# Patient Record
Sex: Female | Born: 1950 | Race: White | Hispanic: No | Marital: Married | Smoking: Former smoker
Health system: Southern US, Community
[De-identification: ages and names within clinical notes are randomized; demographics above are authoritative.]

## PROBLEM LIST (undated history)

## (undated) DIAGNOSIS — R911 Solitary pulmonary nodule: Secondary | ICD-10-CM

## (undated) DIAGNOSIS — I10 Essential (primary) hypertension: Secondary | ICD-10-CM

## (undated) DIAGNOSIS — I493 Ventricular premature depolarization: Secondary | ICD-10-CM

## (undated) DIAGNOSIS — E785 Hyperlipidemia, unspecified: Secondary | ICD-10-CM

## (undated) HISTORY — DX: Essential (primary) hypertension: I10

## (undated) HISTORY — DX: Hyperlipidemia, unspecified: E78.5

## (undated) HISTORY — DX: Ventricular premature depolarization: I49.3

## (undated) HISTORY — DX: Solitary pulmonary nodule: R91.1

---

## 1996-05-08 HISTORY — PX: HERNIA REPAIR: SHX51

## 1997-07-07 ENCOUNTER — Ambulatory Visit (HOSPITAL_COMMUNITY): Admission: RE | Admit: 1997-07-07 | Discharge: 1997-07-07 | Payer: Self-pay | Admitting: *Deleted

## 1998-01-25 ENCOUNTER — Ambulatory Visit (HOSPITAL_COMMUNITY): Admission: RE | Admit: 1998-01-25 | Discharge: 1998-01-25 | Payer: Self-pay

## 1999-05-09 HISTORY — PX: TOTAL VAGINAL HYSTERECTOMY: SHX2548

## 1999-07-11 ENCOUNTER — Ambulatory Visit (HOSPITAL_COMMUNITY): Admission: RE | Admit: 1999-07-11 | Discharge: 1999-07-11 | Payer: Self-pay

## 1999-12-26 ENCOUNTER — Encounter (INDEPENDENT_AMBULATORY_CARE_PROVIDER_SITE_OTHER): Payer: Self-pay

## 1999-12-26 ENCOUNTER — Inpatient Hospital Stay (HOSPITAL_COMMUNITY): Admission: RE | Admit: 1999-12-26 | Discharge: 1999-12-28 | Payer: Self-pay | Admitting: Obstetrics and Gynecology

## 2000-11-01 ENCOUNTER — Ambulatory Visit (HOSPITAL_COMMUNITY): Admission: RE | Admit: 2000-11-01 | Discharge: 2000-11-01 | Payer: Self-pay | Admitting: Obstetrics and Gynecology

## 2000-11-01 ENCOUNTER — Encounter (INDEPENDENT_AMBULATORY_CARE_PROVIDER_SITE_OTHER): Payer: Self-pay

## 2001-03-07 ENCOUNTER — Encounter: Payer: Self-pay | Admitting: Obstetrics and Gynecology

## 2001-03-07 ENCOUNTER — Ambulatory Visit (HOSPITAL_COMMUNITY): Admission: RE | Admit: 2001-03-07 | Discharge: 2001-03-07 | Payer: Self-pay | Admitting: Obstetrics and Gynecology

## 2001-03-12 ENCOUNTER — Encounter: Admission: RE | Admit: 2001-03-12 | Discharge: 2001-03-12 | Payer: Self-pay | Admitting: Obstetrics and Gynecology

## 2001-03-12 ENCOUNTER — Encounter: Payer: Self-pay | Admitting: Obstetrics and Gynecology

## 2001-03-27 ENCOUNTER — Other Ambulatory Visit: Admission: RE | Admit: 2001-03-27 | Discharge: 2001-03-27 | Payer: Self-pay | Admitting: Obstetrics and Gynecology

## 2002-04-16 ENCOUNTER — Other Ambulatory Visit: Admission: RE | Admit: 2002-04-16 | Discharge: 2002-04-16 | Payer: Self-pay | Admitting: Obstetrics and Gynecology

## 2002-05-23 ENCOUNTER — Encounter: Admission: RE | Admit: 2002-05-23 | Discharge: 2002-05-23 | Payer: Self-pay | Admitting: Obstetrics and Gynecology

## 2002-05-23 ENCOUNTER — Encounter: Payer: Self-pay | Admitting: Obstetrics and Gynecology

## 2002-12-31 ENCOUNTER — Ambulatory Visit (HOSPITAL_COMMUNITY): Admission: RE | Admit: 2002-12-31 | Discharge: 2002-12-31 | Payer: Self-pay | Admitting: Obstetrics and Gynecology

## 2002-12-31 ENCOUNTER — Encounter: Payer: Self-pay | Admitting: Obstetrics and Gynecology

## 2003-07-07 ENCOUNTER — Other Ambulatory Visit: Admission: RE | Admit: 2003-07-07 | Discharge: 2003-07-07 | Payer: Self-pay | Admitting: Obstetrics and Gynecology

## 2004-05-20 ENCOUNTER — Ambulatory Visit (HOSPITAL_COMMUNITY): Admission: RE | Admit: 2004-05-20 | Discharge: 2004-05-20 | Payer: Self-pay | Admitting: Obstetrics and Gynecology

## 2005-03-17 ENCOUNTER — Ambulatory Visit (HOSPITAL_COMMUNITY): Admission: RE | Admit: 2005-03-17 | Discharge: 2005-03-17 | Payer: Self-pay | Admitting: Family Medicine

## 2005-03-17 IMAGING — CR DG CHEST 2V
2 series · 2 of 2 positions shown · non-contrast
Comparison: None.

CLINICAL DATA: Cough and shortness of breath.
CHEST - 2 VIEW ? [DATE]:

[w chest pa]
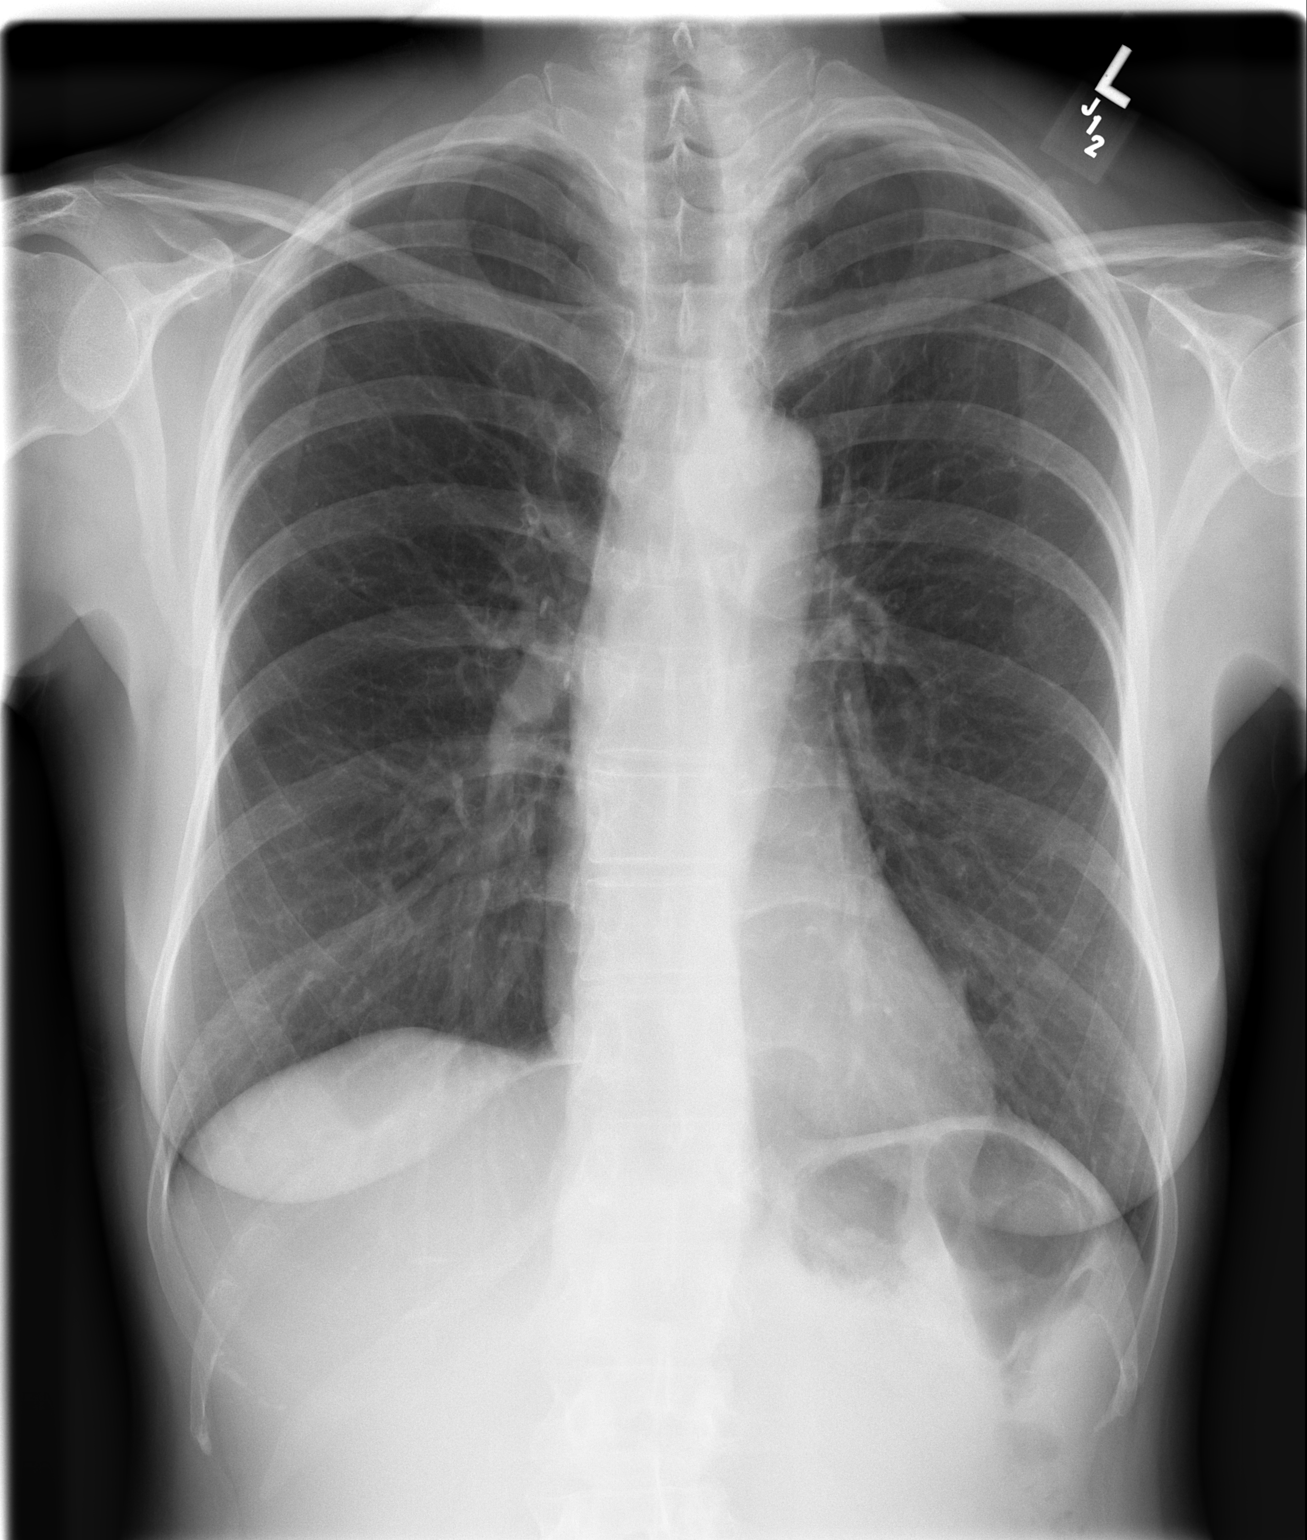

[w chest lat]
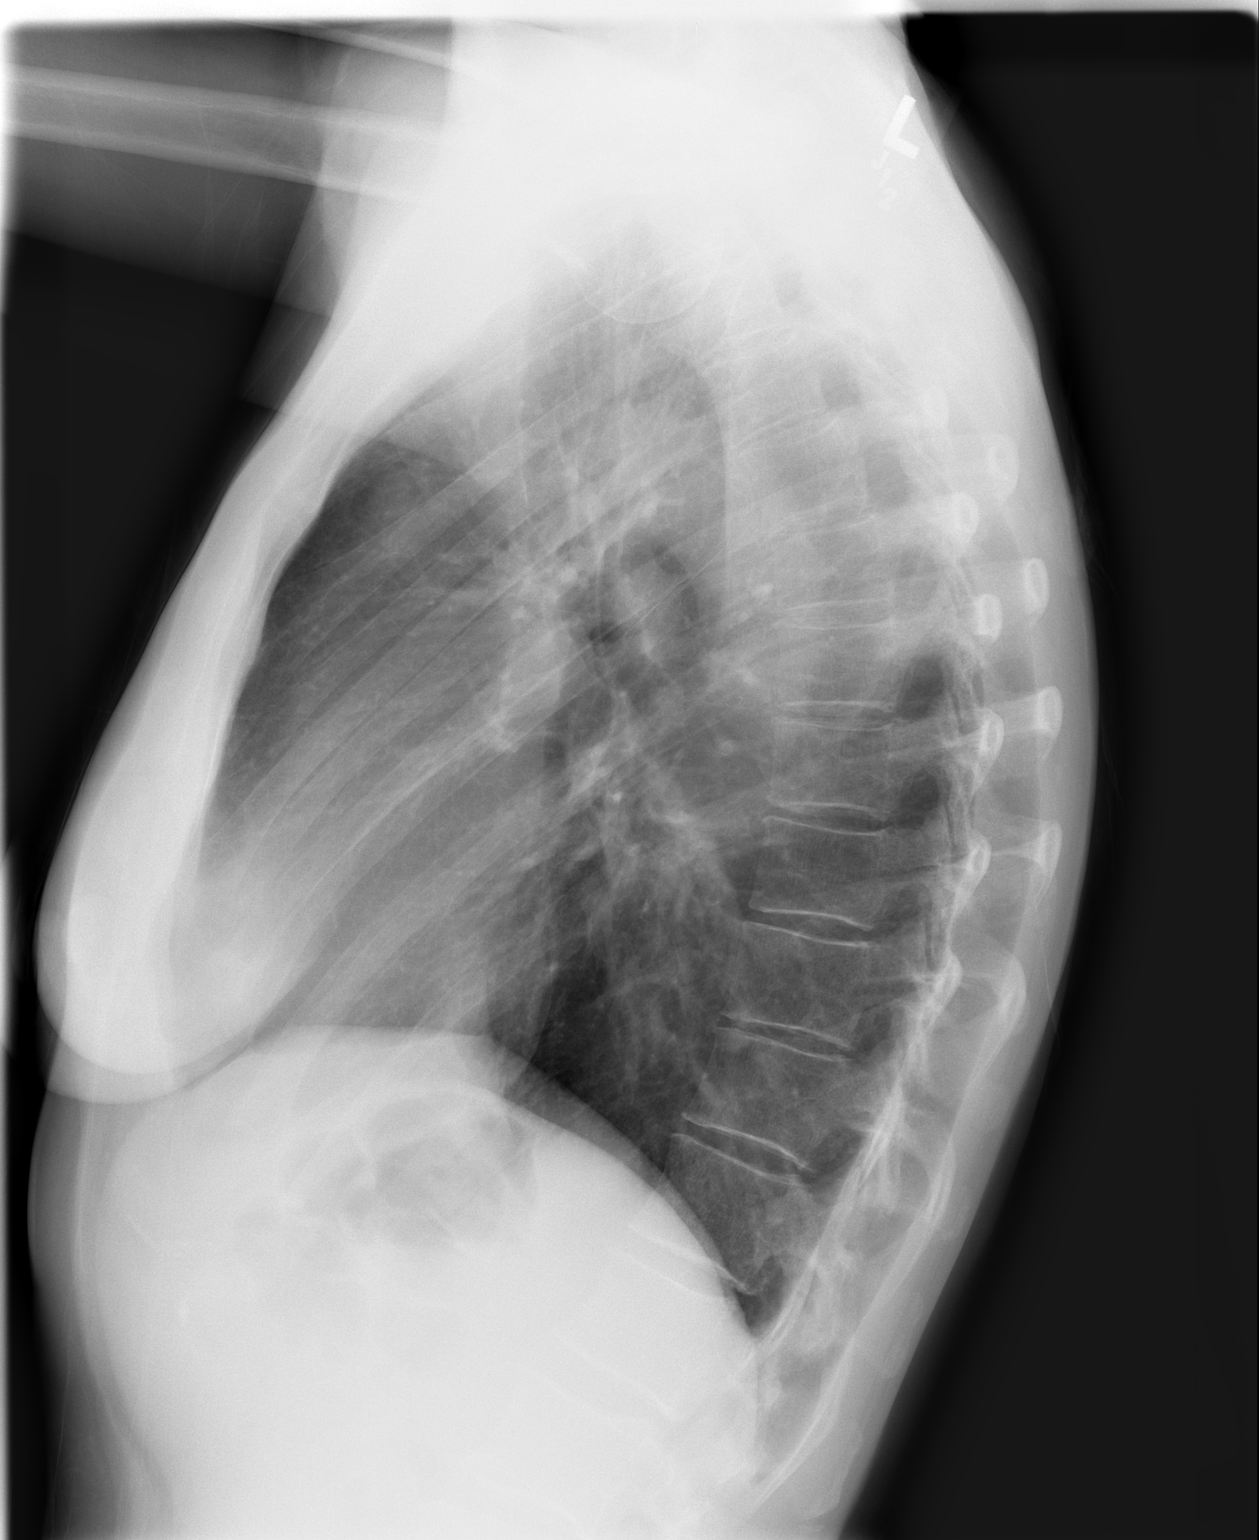

[2 of 2 positions shown; findings below may reference images not displayed]

FINDINGS: Two views of the chest demonstrate normal heart size.  There are no effusions or edema.  No acute air space opacities.  There is a focal opacity in the left lung base.  The finding is nonspecific and may represent a small nodule.  Recommend comparison with outside films if available.  Alternatively, three month follow-up exam is recommended.
IMPRESSION: Suspicous nodular opacity at the left lung base.

## 2005-03-23 ENCOUNTER — Encounter: Admission: RE | Admit: 2005-03-23 | Discharge: 2005-03-23 | Payer: Self-pay | Admitting: Family Medicine

## 2005-11-01 ENCOUNTER — Ambulatory Visit (HOSPITAL_COMMUNITY): Admission: RE | Admit: 2005-11-01 | Discharge: 2005-11-01 | Payer: Self-pay | Admitting: Family Medicine

## 2007-10-04 ENCOUNTER — Ambulatory Visit (HOSPITAL_COMMUNITY): Admission: RE | Admit: 2007-10-04 | Discharge: 2007-10-04 | Payer: Self-pay | Admitting: Obstetrics and Gynecology

## 2007-11-11 ENCOUNTER — Emergency Department: Payer: Self-pay | Admitting: Emergency Medicine

## 2010-02-18 ENCOUNTER — Ambulatory Visit (HOSPITAL_COMMUNITY): Admission: RE | Admit: 2010-02-18 | Discharge: 2010-02-18 | Payer: Self-pay | Admitting: Obstetrics and Gynecology

## 2010-09-23 NOTE — Discharge Summary (Signed)
[facility]  Patient:    Michelle Moreno, Michelle Moreno                     MRN: [id number] Adm. Date:  [id number] Disc. Date: [id number] Attending:  Lenoard Aden                           Discharge Summary  DISCHARGE DIAGNOSIS:  Symptomatic fibroids with menometrorrhagia and enterocele.  HOSPITAL COURSE:  The patient underwent LAVHBSO, uterine morcellization, McCullough culdoplasty and multiple myomectomy on December 26, 1999. Postoperative course uncomplicated.  Postoperative anemia was stable.  She was discharged to home on day #2.  DISCHARGE MEDICATIONS:  Iron supplementation and Tylox given for pain.  FOLLOW-UP:  Follow up in the office in four to six weeks.  Discharge teaching done. DD:  01/12/00 TD:  01/14/00 Job: [id number]

## 2010-09-23 NOTE — Op Note (Signed)
[facility]  Patient:    Michelle Moreno, Michelle Moreno                     MRN: [id number] Proc. Date: 12/26/99 Attending:  Lenoard Aden, M.D.                           Operative Report  PREOPERATIVE DIAGNOSIS:  Symptomatic uterine fibroids, menometrorrhagia secondary anemia.  POSTOPERATIVE DIAGNOSIS:  Symptomatic uterine fibroids, menometrorrhagia secondary anemia.  OPERATION PERFORMED:  Laparoscopically assisted vaginal hysterectomy, bilateral salpingo-oophorectomy and extensive uterine morcellization with McCalls culdoplasty.  SURGEON:  Lenoard Aden, M.D.  ASSISTANT:  Pershing Cox, M.D.  ANESTHESIA:  General.  ESTIMATED BLOOD LOSS:  1250 cc.  COMPLICATIONS:  None.  FLUIDS:  Crystalloid 3700 cc.  URINE OUTPUT:  200 cc.  DRAINS:  Vaginal pack and Foley.  CONDITION:  The patient to recovery in good condition.  DESCRIPTION OF PROCEDURE:  After being appraised of the risks of anesthesia, infection, bleeding, injury to abdominal organs, need for repair, the patient was brought to the operating room where she was prepped and draped in the usual sterile fashion and administered a general anesthetic.  A Foley catheter was placed and the Hulka tenaculum placed per vagina.  At this time, exam under anesthesia reveals a bulky retroflexed uterus with good mobility and no adnexal masses.  At this time, attention the patients feet were placed in the stirrups and attention is turned to the abdominal portion of the procedure where by an infraumbilical incision was made with a scalpel.  A Veress needle placed and opening pressure of -2 noted, and 5-liters of CO2 insufflated without difficulty.  A 10 mm trocar placed atraumatically.  Pictures were taken.  Two 5 mm trocar sites were made in the bilateral lower quadrants atraumatically after transilluminating the anterior abdominal wall to avoid any blood vessels.  The liver and gallbladder bed  appeared normal.  The appendiceal bed appeared normal.  The uterus is markedly bulky with a completely clear anterior and posterior cul-de-sac, however, manipulation is difficult due to the size of the uterus.  At this time, tripolar cautery is entered after the right ureter is identified.  The right infundibulopelvic ligament is cauterized and ligated. Progressive bites using the tripolar are taken down to the round ligament and then down to the level of the uterine vessels on the right.  The same procedure is done on the left side whereby the tripolar was used after the left ureter was identified down to the round ligament and then down to the level of the uterine vessels ligating all pedicles in the process.  The bladder flap was developed sharply.  At this time, attention is turned to the vaginal portion of the procedure whereby Deri Fuelling are placed anteriorly and posteriorly on the cervical lip and the cervicovaginal junction is scored using a scalpel after infiltrating with the Xylocaine and epinephrine solution.  Posterior cul-de-sac entry made atraumatically.  There is a small crater along the vaginal cuff which is tied using a 3-0 Vicryl tie.  The uterosacrals are bilaterally clamped and suture ligated, and transfixed to the vaginal cuff. Progressive bites of the cardinal ligament complexes are taken using Heaney clamps with continuing advancement of the bladder flap using sharp dissection. At this time, the anterior cul-de-sac entry is made atraumatically and a Deaver retractor is placed.  At this time, the uterine vessels are bilaterally clamped and suture  ligated.  At this time, it is noted that due to the extensive size of the specimen, uterine morcellization will be necessary. After identifying what appeared to be uterine vessels bilaterally, the cervix is amputated and wedge shaped portions are taken out of the anterior and posterior portions of the uterus staying  inside the blood vessels laterally.  At this time, removal was more easily accomplished along the left side whereby progressive bites using Heaney clamps and Masterson clamps are taken up to the level of the laparoscopic suture line.  The specimen is divided on the left side, but still unable to be removed due to marked enlargement of the specimen extensive morcellization coring out uterine fibroids and performing a multiple myomectomy by extravasating these fibroids and grasping them with a Christella Hartigan tenaculae and pulling them out in order to collapse the specimen.  After complete collapsing of the specimen, the uterus is then removed.  It is still attached to the right side whereby the one last pedicle was clamped and suture ligated.  The enterocele identified at the time of the procedure.  It is closed using an internal and external McCalls culdoplasty stitch using 0 Vicryl suture.  The vagina closed side-to-side using 0 Vicryl popoff and packing is placed.  At this time, attention is turned to the abdominal portion of the procedure whereby all pedicles are inspected.  There is a left adnexal pedicle which appears to be oozing.  After letting out the pressure, the abdomen is then reinsufflated and this pedicle is cauterized using the tripolar cautery after identifying the ureters bilaterally.  At this time once again, good hemostasis was achieved.  All instruments were removed under direct visualization after irrigation with the irrigator.  The incisions are closed using 0 and 4-0 Vicryl with dilute Marcaine solution placed.  The patient tolerated the procedure well and was transferred to the recovery room in good condition.  Due to the extensive size of the uterus and the extensive morcellization and myomectomy as necessary, total time of this case was approximately three hours.  DD:  12/26/99 TD:  12/27/99 Job: [id number]

## 2010-09-23 NOTE — H&P (Signed)
[facility]  Patient:    Michelle Moreno, Michelle Moreno                       MRN: [id number] Adm. Date:  12/26/99 Attending:  Lenoard Aden, M.D.                         History and Physical  CHIEF COMPLAINT:  Symptomatic uterine fibroids and menorrhagia with secondary anemia.  HISTORY OF PRESENT ILLNESS:  The patient is a 60 year old white female, G3, P1-0-2-1, who presented initially on November 03, 1999 with secondary anemia, menometrorrhagia and pelvic pain.  Her hemoglobin was 6.7.  She was previously given a Lo/Ovral taper, which markedly decreased her bleeding.  PAST SURGICAL HISTORY:  Hernia repair March 2001.  ALLERGIES:  None.  MEDICATIONS:  Lo/Ovral, iron, ibuprofen and Benadryl.  FAMILY HISTORY:  Myocardial infarction and breast cancer.  PAST GYNECOLOGIC HISTORY:  History of tubal ligation.  She has had one uncomplicated vaginal delivery and two uncomplicated abortions.  PHYSICAL EXAMINATION:  GENERAL:  Well-developed well-nourished white female.  VITAL SIGNS:  Weight 146 pounds.  HEENT:  Normal.  LUNGS:  Clear.  HEART:  Regular rate and rhythm.  ABDOMEN:  Soft, scaphoid and nontender.  PELVIC:  The uterus is large and retroflexed.  No adnexal masses are appreciated.  IMPRESSION: 1. Symptomatic uterine fibroids with menometrorrhagia. 2. Secondary anemia. 3. Dysmenorrhea. 4. Desire for definitive therapy.  PLAN:  Proceed with laparoscopically assisted vaginal hysterectomy, bilateral salpingo-oophorectomy, possible total abdominal hysterectomy and bilateral salpingo-oophorectomy.  The risks of anesthesia, infection, bleeding, injury to abdominal organs with the need for repair were discussed.  Laparoscopic complications to include delayed versus immediate complications including bowel injury or bladder injury with the possible need for repair were noted. The patient acknowledges and desires to proceed. DD:  12/25/99 TD:   12/26/99 Job: [id number]

## 2010-09-23 NOTE — Op Note (Signed)
[facility]  Patient:    Michelle Moreno, Michelle Moreno                       MRN: [id number] Proc. Date: 11/01/00 Attending:  Lenoard Aden, M.D. CC:         [facility]   Operative Report  PREOPERATIVE DIAGNOSES:       1. Postcoital bleeding.                               2. Dyspareunia.                               3. Vaginal apex lesion.  POSTOPERATIVE DIAGNOSIS:      1. Postcoital bleeding.                               2. Dyspareunia.                               3. Vaginal apex lesion.  PROCEDURE:                    Excision of vaginal apex.  SURGEON:                      Lenoard Aden, M.D.  ANESTHESIA:                   General.  ESTIMATED BLOOD LOSS:         50 cc.  COMPLICATIONS:                None.  DRAINS:                       None.  SPECIMEN:                     Vaginal apex.  CONDITION:                    The patient went to recovery in good condition.  DESCRIPTION OF PROCEDURE:     After being apprised of the risks of anesthesia, infection, bleeding, injury to abdominal organs and need for repair, the patient was brought to the operating room where she was administered a general anesthesia without complications.  Prepped and draped in the usual sterile fashion.  Bladder drained until empty using a red rubber catheter.  At this time, a weighted speculum was placed.  A ______ solution used to infiltrate the mid to left vaginal apex, which shows an area of apparent extruding glandular tissue consistent with either chronic inflammation or chronic granulation tissue.  After infiltration of the lesion, the edges are undercircumscribed using a scalpel and undermined using Metzenbaum scissors. The lesion is removed piecemeal.  The base of the lesion is then cauterized using electrocautery.  The edges are cleaned and undermined using Metzenbaum scissors and the apical lesion defect is closed using 3-0 Vicryl in an interrupted  fashion using figure-of-eight sutures and a cephalocaudad direction.  At this time, good hemostasis achieved.  All instruments are removed.  The patient tolerated the procedure and transferred to the recovery room in good condition. DD:  11/01/00 TD:  11/01/00 Job: [id number]  [id number]

## 2011-09-19 ENCOUNTER — Encounter: Payer: Self-pay | Admitting: *Deleted

## 2011-10-17 ENCOUNTER — Encounter: Payer: Self-pay | Admitting: Internal Medicine

## 2011-12-08 ENCOUNTER — Ambulatory Visit (AMBULATORY_SURGERY_CENTER): Payer: Medicare HMO | Admitting: *Deleted

## 2011-12-08 VITALS — Ht 67.5 in | Wt 146.2 lb

## 2011-12-08 DIAGNOSIS — Z1211 Encounter for screening for malignant neoplasm of colon: Secondary | ICD-10-CM

## 2011-12-08 MED ORDER — MOVIPREP 100 G PO SOLR: ORAL | Status: DC

## 2011-12-22 ENCOUNTER — Encounter: Payer: Self-pay | Admitting: Internal Medicine

## 2012-01-17 ENCOUNTER — Telehealth: Payer: Self-pay | Admitting: Gastroenterology

## 2012-01-17 NOTE — Telephone Encounter (Signed)
Pt is calling to let us know she is not taking Advair anymore.

## 2012-01-19 ENCOUNTER — Encounter: Payer: Self-pay | Admitting: Gastroenterology

## 2012-01-19 ENCOUNTER — Ambulatory Visit (AMBULATORY_SURGERY_CENTER): Payer: Medicare HMO | Admitting: Gastroenterology

## 2012-01-19 VITALS — BP 144/96 | HR 66 | Temp 98.1°F | Resp 17 | Ht 67.5 in | Wt 146.0 lb

## 2012-01-19 DIAGNOSIS — D126 Benign neoplasm of colon, unspecified: Secondary | ICD-10-CM

## 2012-01-19 DIAGNOSIS — Z1211 Encounter for screening for malignant neoplasm of colon: Secondary | ICD-10-CM

## 2012-01-19 MED ORDER — SODIUM CHLORIDE 0.9 % IV SOLN: 500.0000 mL | INTRAVENOUS | Status: DC

## 2012-01-19 NOTE — Progress Notes (Signed)
Pressure applied to the abdomen to reach the cecum 

## 2012-01-19 NOTE — Progress Notes (Signed)
Patient did not experience any of the following events: a burn prior to discharge; a fall within the facility; wrong site/side/patient/procedure/implant event; or a hospital transfer or hospital admission upon discharge from the facility. (G8907) Patient did not have preoperative order for IV antibiotic SSI prophylaxis. (G8918)  

## 2012-01-19 NOTE — Patient Instructions (Addendum)

## 2012-01-19 NOTE — Op Note (Signed)
[facility] [address] [place], [address]   COLONOSCOPY PROCEDURE REPORT  PATIENT: Syd, Manges  MR#: [id number] BIRTHDATE: 1950/05/27 , 61  yrs. old GENDER: Female ENDOSCOPIST: Meryl Dare, MD, [facility] REFERRED OZ:HYQMV Hamrick, M.D.  Olivia Mackie, M.D. PROCEDURE DATE:  01/19/2012 PROCEDURE:   Colonoscopy with snare polypectomy ASA CLASS:   Class II INDICATIONS:average risk screening. MEDICATIONS: MAC sedation, administered by CRNA and propofol (Diprivan) 250mg  IV DESCRIPTION OF PROCEDURE:   After the risks benefits and alternatives of the procedure were thoroughly explained, informed consent was obtained.  A digital rectal exam revealed no abnormalities of the rectum.   The LB [id number]-[id number]  endoscope was introduced through the anus and advanced to the cecum, which was identified by both the appendix and ileocecal valve. No adverse events experienced.   The quality of the prep was excellent, using MoviPrep  The instrument was then slowly withdrawn as the colon was fully examined.   COLON FINDINGS: A single polyp measuring 6 mm in size was found in the sigmoid colon.  A polypectomy was performed with a cold snare. The resection was complete and the polyp tissue was completely retrieved.   Moderate diverticulosis was noted in the sigmoid colon. The colon was otherwise normal. There was no diverticulosis, inflammation, polyps or cancers unless previously stated. Retroflexed views revealed moderate internal hemorrhoids. The time to cecum=7 minutes 0 seconds.  Withdrawal time=10 minutes 03 seconds.  The scope was withdrawn and the procedure completed.  COMPLICATIONS: There were no complications.  ENDOSCOPIC IMPRESSION: 1.   Single polyp in the sigmoid colon; polypectomy was performed with a cold snare 2.   Moderate diverticulosis was noted in the sigmoid colon 3.   Internal hemorrhoids  RECOMMENDATIONS: 1.  await pathology results 2.   High fiber diet with liberal fluid intake. 3.  Repeat colonoscopy in 5 years if polyp adenomatous; otherwise 10 years eSigned:  Meryl Dare, MD, [facility] 01/19/2012 11:08 AM

## 2012-01-22 ENCOUNTER — Telehealth: Payer: Self-pay | Admitting: *Deleted

## 2012-01-22 NOTE — Telephone Encounter (Signed)
No answer at # given.  Message left on voicemail. 

## 2012-01-23 ENCOUNTER — Encounter: Payer: Self-pay | Admitting: Gastroenterology

## 2012-10-14 ENCOUNTER — Other Ambulatory Visit (HOSPITAL_COMMUNITY): Payer: Self-pay | Admitting: Obstetrics and Gynecology

## 2012-10-14 DIAGNOSIS — Z1231 Encounter for screening mammogram for malignant neoplasm of breast: Secondary | ICD-10-CM

## 2012-10-18 ENCOUNTER — Ambulatory Visit (HOSPITAL_COMMUNITY)
Admission: RE | Admit: 2012-10-18 | Discharge: 2012-10-18 | Disposition: A | Discharge status: Home / Self Care | Payer: BC Managed Care – PPO | Source: Ambulatory Visit | Attending: Obstetrics and Gynecology | Admitting: Obstetrics and Gynecology

## 2012-10-18 DIAGNOSIS — Z1231 Encounter for screening mammogram for malignant neoplasm of breast: Secondary | ICD-10-CM | POA: Insufficient documentation

## 2012-10-21 ENCOUNTER — Other Ambulatory Visit: Payer: Self-pay | Admitting: Obstetrics and Gynecology

## 2012-10-21 DIAGNOSIS — R928 Other abnormal and inconclusive findings on diagnostic imaging of breast: Secondary | ICD-10-CM

## 2012-11-01 ENCOUNTER — Ambulatory Visit
Admission: RE | Admit: 2012-11-01 | Discharge: 2012-11-01 | Disposition: A | Discharge status: Home / Self Care | Payer: BC Managed Care – PPO | Source: Ambulatory Visit | Attending: Obstetrics and Gynecology | Admitting: Obstetrics and Gynecology

## 2012-11-01 DIAGNOSIS — R928 Other abnormal and inconclusive findings on diagnostic imaging of breast: Secondary | ICD-10-CM

## 2013-01-01 ENCOUNTER — Ambulatory Visit (INDEPENDENT_AMBULATORY_CARE_PROVIDER_SITE_OTHER): Payer: Self-pay | Admitting: Surgery

## 2013-01-13 ENCOUNTER — Ambulatory Visit (INDEPENDENT_AMBULATORY_CARE_PROVIDER_SITE_OTHER): Payer: BC Managed Care – PPO | Admitting: Surgery

## 2014-02-12 ENCOUNTER — Institutional Professional Consult (permissible substitution): Payer: BC Managed Care – PPO | Admitting: Internal Medicine

## 2014-03-20 ENCOUNTER — Institutional Professional Consult (permissible substitution): Payer: BC Managed Care – PPO | Admitting: Internal Medicine

## 2014-05-12 ENCOUNTER — Telehealth: Payer: Self-pay | Admitting: Internal Medicine

## 2014-05-12 NOTE — Telephone Encounter (Signed)
Close encounter 

## 2014-05-13 ENCOUNTER — Institutional Professional Consult (permissible substitution): Payer: BC Managed Care – PPO | Admitting: Internal Medicine

## 2014-05-19 ENCOUNTER — Ambulatory Visit: Payer: Self-pay | Admitting: Family Medicine

## 2014-05-27 ENCOUNTER — Telehealth: Payer: Self-pay | Admitting: Internal Medicine

## 2014-05-27 NOTE — Telephone Encounter (Signed)
Received records from [facility] (Dr Daiva Eves) for appointment with Dr Debara Pickett on 06/11/14.  Records given to [facility] (medical records) for Dr Lysbeth Penner schedule on 06/11/14. lp

## 2014-06-11 ENCOUNTER — Encounter: Payer: Self-pay | Admitting: Internal Medicine

## 2014-06-11 ENCOUNTER — Ambulatory Visit (INDEPENDENT_AMBULATORY_CARE_PROVIDER_SITE_OTHER): Payer: 59 | Admitting: Internal Medicine

## 2014-06-11 VITALS — BP 156/90 | HR 88 | Ht 67.5 in | Wt 155.0 lb

## 2014-06-11 DIAGNOSIS — E785 Hyperlipidemia, unspecified: Secondary | ICD-10-CM

## 2014-06-11 DIAGNOSIS — F17200 Nicotine dependence, unspecified, uncomplicated: Secondary | ICD-10-CM

## 2014-06-11 DIAGNOSIS — Z8249 Family history of ischemic heart disease and other diseases of the circulatory system: Secondary | ICD-10-CM

## 2014-06-11 DIAGNOSIS — Z720 Tobacco use: Secondary | ICD-10-CM

## 2014-06-11 DIAGNOSIS — R9431 Abnormal electrocardiogram [ECG] [EKG]: Secondary | ICD-10-CM | POA: Insufficient documentation

## 2014-06-11 DIAGNOSIS — I493 Ventricular premature depolarization: Secondary | ICD-10-CM

## 2014-06-11 DIAGNOSIS — I10 Essential (primary) hypertension: Secondary | ICD-10-CM

## 2014-06-11 NOTE — Progress Notes (Signed)
OFFICE NOTE  Chief Complaint:  Risk factor assessment  Primary Care Physician: Leonides Sake, MD  HPI:  Michelle Moreno is a pleasant 64 year old female coming referred to me by Dr. Edwyna Ready. Her primary care provider is Dr. Lisbeth Ply. Her family history is very significant for heart disease. She tells me that her mother had a abdominal aortic aneurysm and is deceased. Her father heart disease. She also has a brother has heart disease at a young age and a sister who had a cerebral aneurysm and died. Personally she has no known coronary history. She does have a history of smoking in the past and risk factors including hypertension, age, dyslipidemia and is had PVCs. She denies any chest pain or worsening shortness of breath with exertion however is not particularly active. She is going to start doing yoga soon and wants to know if it's okay for her to start exercise. She also has concerns about her mother who had abdominal aortic aneurysm. Given her history of smoking and hypertension as well as family history she may be at risk for this. In addition, she is wondering whether she should have screening for cerebral aneurysms. Current guidelines recommend screening for cerebral aneurysms in patients who have 2 first-degree relatives with cerebral aneurysms. The incidence of cerebral aneurysm in patients with one first-degree relative is very low around 1%, therefore screening is not likely to be beneficial.  PMHx:  Past Medical History  Diagnosis Date  . Lung nodule   . Asthma   . PVC's (premature ventricular contractions)   . HTN (hypertension)   . Hyperlipidemia     Past Surgical History  Procedure Laterality Date  . Total vaginal hysterectomy  2001  . Hernia repair  1998    abdominal    FAMHx:  Family History  Problem Relation Age of Onset  . Coronary artery disease Brother   . Cerebral aneurysm Sister   . Alzheimer's disease    . Hypertension    . Colon cancer Neg Hx   .  Stomach cancer Neg Hx     SOCHx:   reports that she quit smoking about 13 years ago. She has never used smokeless tobacco. She reports that she drinks about 4.2 oz of alcohol per week. She reports that she does not use illicit drugs.  ALLERGIES:  No Known Allergies  ROS: A comprehensive review of systems was negative.  HOME MEDS: Current Outpatient Prescriptions  Medication Sig Dispense Refill  . busPIRone (BUSPAR) 15 MG tablet Take 7.5 mg by mouth 2 (two) times daily.    Marland Kitchen estradiol (CLIMARA - DOSED IN MG/24 HR) 0.1 mg/24hr Place 1 patch onto the skin once a week.     . Fluticasone-Salmeterol (ADVAIR) 250-50 MCG/DOSE AEPB Inhale 1 puff into the lungs every 12 (twelve) hours.    . meloxicam (MOBIC) 15 MG tablet Take 15 mg by mouth daily. with food  2   No current facility-administered medications for this visit.    LABS/IMAGING: No results found for this or any previous visit (from the past 48 hour(s)). No results found.  VITALS: BP 156/90 mmHg  Pulse 88  Ht 5' 7.5" (1.715 m)  Wt 155 lb (70.308 kg)  BMI 23.90 kg/m2  EXAM: General appearance: alert and no distress Neck: no carotid bruit and no JVD Lungs: clear to auscultation bilaterally Heart: regular rate and rhythm, S1, S2 normal, no murmur, click, rub or gallop and Occasional skipped beats Abdomen: soft, non-tender; bowel sounds normal; no masses,  no organomegaly Extremities: extremities normal, atraumatic, no cyanosis or edema Pulses: 2+ and symmetric Skin: Skin color, texture, turgor normal. No rashes or lesions Neurologic: Grossly normal Psych: Pleasant  EKG: Normal sinus rhythm at 88, nonspecific T wave changes, mildly prolonged QT interval at 493 ms  ASSESSMENT: 1. Abnormal EKG 2. History of tobacco abuse 3. Hypertension 4. Family history of abdominal aortic aneurysm and cerebral aneurysm 5. Dyslipidemia  PLAN: 1.   Mrs. Farone has a number of risk factors for heart disease and a family history  which is concerning. She was a smoker and has a history of hypertension. In her 70s with a first-degree relative of abdominal aortic aneurysm. I would recommend a screening abdominal ultrasound to rule out aneurysm. I did not detect any significant aneurysm on exam however this is a low sensitivity test. In addition, she is content to play starting exercise program and does have some abnormalities on her EKG. I recommended exercise treadmill stress test to evaluate for any ischemia. To answer the question of whether she has coronary disease or not a coronary calcium score would be helpful in further risk stratify her. Finally, based on her first-degree relative with cerebral aneurysms comments reasonable to consider a head MRI, however the yield is fairly low at only 1%. This is somewhat reassuring. I think we can probably feel pretty comfortable that she is at low risk for cerebral aneurysm based on only one family member with this.  Plan to see her back to discuss results of her studies in a few weeks. We'll go ahead and obtain laboratory work including cholesterol profile from her gynecologist.  Thanks as always for the kind referral.  Pixie Casino, MD, [facility] Attending Cardiologist [facility]  HILTY,Kenneth C 06/11/2014, 1:15 PM

## 2014-06-11 NOTE — Patient Instructions (Addendum)
Your physician has requested that you have an exercise tolerance test. For further information please visit [email or web address]. Please also follow instruction sheet, as given.  Your physician has requested that you have an abdominal aorta duplex. During this test, an ultrasound is used to evaluate the aorta. Allow 30 minutes for this exam. Do not eat after midnight the day before and avoid carbonated beverages  Dr. Debara Pickett has ordered a coronary calcium score - this is done at our [facility] office - [address]   Your physician recommends that you schedule a follow-up appointment after your tests.

## 2014-06-18 ENCOUNTER — Ambulatory Visit (INDEPENDENT_AMBULATORY_CARE_PROVIDER_SITE_OTHER)
Admission: RE | Admit: 2014-06-18 | Discharge: 2014-06-18 | Disposition: A | Discharge status: Home / Self Care | Payer: 59 | Source: Ambulatory Visit | Attending: Internal Medicine | Admitting: Internal Medicine

## 2014-06-18 DIAGNOSIS — R9431 Abnormal electrocardiogram [ECG] [EKG]: Secondary | ICD-10-CM

## 2014-06-18 DIAGNOSIS — Z8249 Family history of ischemic heart disease and other diseases of the circulatory system: Secondary | ICD-10-CM

## 2014-06-18 DIAGNOSIS — I10 Essential (primary) hypertension: Secondary | ICD-10-CM

## 2014-06-30 ENCOUNTER — Telehealth (HOSPITAL_COMMUNITY): Payer: Self-pay

## 2014-06-30 NOTE — Telephone Encounter (Signed)
Encounter complete. 

## 2014-07-02 ENCOUNTER — Ambulatory Visit (HOSPITAL_BASED_OUTPATIENT_CLINIC_OR_DEPARTMENT_OTHER)
Admission: RE | Admit: 2014-07-02 | Discharge: 2014-07-02 | Disposition: A | Discharge status: Home / Self Care | Payer: 59 | Source: Ambulatory Visit | Attending: Cardiovascular Disease | Admitting: Cardiovascular Disease

## 2014-07-02 ENCOUNTER — Ambulatory Visit (HOSPITAL_COMMUNITY)
Admission: RE | Admit: 2014-07-02 | Discharge: 2014-07-02 | Disposition: A | Discharge status: Home / Self Care | Payer: 59 | Source: Ambulatory Visit | Attending: Cardiovascular Disease | Admitting: Cardiovascular Disease

## 2014-07-02 DIAGNOSIS — Z8249 Family history of ischemic heart disease and other diseases of the circulatory system: Secondary | ICD-10-CM

## 2014-07-02 DIAGNOSIS — R9431 Abnormal electrocardiogram [ECG] [EKG]: Secondary | ICD-10-CM | POA: Insufficient documentation

## 2014-07-02 DIAGNOSIS — F17200 Nicotine dependence, unspecified, uncomplicated: Secondary | ICD-10-CM | POA: Insufficient documentation

## 2014-07-02 DIAGNOSIS — I10 Essential (primary) hypertension: Secondary | ICD-10-CM | POA: Diagnosis not present

## 2014-07-02 NOTE — Procedures (Signed)
Exercise Treadmill Test  Pre-Exercise Testing Evaluation NSR  Test  Exercise Tolerance Test Ordering MD: Lyman Bishop, MD  Interpreting MD:   Unique Test No: 1 Treadmill:  1  Indication for ETT: Palpitations Contraindication to ETT: No   Stress Modality: exercise - treadmill  Cardiac Imaging Performed: non   Protocol: standard Bruce - maximal  Max BP:  183/107  Max MPHR (bpm): 157 85% MPR (bpm):133  MPHR obtained (bpm):  144 % MPHR obtained:  91  Reached 85% MPHR (min:sec):  3:50 Total Exercise Time (min-sec): 6:00  Workload in METS:  7.00 Borg Scale:   Reason ETT Terminated:  dyspnea    ST Segment Analysis At Rest: normal ST segments - no evidence of significant ST depression With Exercise: no evidence of significant ST depression  Other Information Arrhythmia:  No Angina during ETT:  absent (0) Quality of ETT:  diagnostic  ETT Interpretation:  normal - no evidence of ischemia by ST analysis  Comments: Hypertension at rest and hypertensive response to exercise. Fair exercise tolerance.    Sanda Klein, MD, [facility] [phone] office [phone] pager

## 2014-07-02 NOTE — Progress Notes (Signed)
Abdominal Aortic Duplex Completed °Brianna L Mazza,RVT °

## 2014-07-06 ENCOUNTER — Telehealth: Payer: Self-pay | Admitting: Internal Medicine

## 2014-07-07 NOTE — Telephone Encounter (Signed)
Close encounter 

## 2014-07-08 ENCOUNTER — Encounter: Payer: Self-pay | Admitting: Internal Medicine

## 2014-07-08 ENCOUNTER — Ambulatory Visit (INDEPENDENT_AMBULATORY_CARE_PROVIDER_SITE_OTHER): Payer: 59 | Admitting: Internal Medicine

## 2014-07-08 VITALS — BP 143/95 | HR 82 | Ht 67.5 in | Wt 154.7 lb

## 2014-07-08 DIAGNOSIS — I493 Ventricular premature depolarization: Secondary | ICD-10-CM

## 2014-07-08 DIAGNOSIS — I77810 Thoracic aortic ectasia: Secondary | ICD-10-CM

## 2014-07-08 DIAGNOSIS — R911 Solitary pulmonary nodule: Secondary | ICD-10-CM

## 2014-07-08 DIAGNOSIS — I7121 Aneurysm of the ascending aorta, without rupture: Secondary | ICD-10-CM

## 2014-07-08 DIAGNOSIS — R9431 Abnormal electrocardiogram [ECG] [EKG]: Secondary | ICD-10-CM

## 2014-07-08 DIAGNOSIS — I10 Essential (primary) hypertension: Secondary | ICD-10-CM

## 2014-07-08 DIAGNOSIS — E785 Hyperlipidemia, unspecified: Secondary | ICD-10-CM

## 2014-07-08 DIAGNOSIS — Z79899 Other long term (current) drug therapy: Secondary | ICD-10-CM

## 2014-07-08 DIAGNOSIS — I712 Thoracic aortic aneurysm, without rupture: Secondary | ICD-10-CM | POA: Insufficient documentation

## 2014-07-08 MED ORDER — ATORVASTATIN CALCIUM 40 MG PO TABS: 40.0000 mg | ORAL_TABLET | Freq: Every day | ORAL | Status: DC

## 2014-07-08 MED ORDER — VALSARTAN 80 MG PO TABS: 80.0000 mg | ORAL_TABLET | Freq: Every day | ORAL | Status: DC

## 2014-07-08 NOTE — Progress Notes (Signed)
OFFICE NOTE  Chief Complaint:   Follow-up studies  Primary Care Physician: Leonides Sake, MD  HPI:  Michelle Moreno is a pleasant 64 year old female coming referred to me by Dr. Edwyna Ready. Her primary care provider is Dr. Lisbeth Ply. Her family history is very significant for heart disease. She tells me that her mother had a abdominal aortic aneurysm and is deceased. Her father heart disease. She also has a brother has heart disease at a young age and a sister who had a cerebral aneurysm and died. Personally she has no known coronary history. She does have a history of smoking in the past and risk factors including hypertension, age, dyslipidemia and is had PVCs. She denies any chest pain or worsening shortness of breath with exertion however is not particularly active. She is going to start doing yoga soon and wants to know if it's okay for her to start exercise. She also has concerns about her mother who had abdominal aortic aneurysm. Given her history of smoking and hypertension as well as family history she may be at risk for this. In addition, she is wondering whether she should have screening for cerebral aneurysms. Current guidelines recommend screening for cerebral aneurysms in patients who have 2 first-degree relatives with cerebral aneurysms. The incidence of cerebral aneurysm in patients with one first-degree relative is very low around 1%, therefore screening is not likely to be beneficial.  I saw [name] back in the office today. 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 This was also noted on the radiology over read including a number of pulmonary nodules and hypodensities in the liver consistent with probably cysts. She also underwent abdominal ultrasound which was negative for any abdominal aortic aneurysm. While there was no coronary calcium there is  atherosclerosis of the abdominal aorta that was noted. Laboratory work was reviewed from her primary care provider including a total cholesterol 240 with LDL greater than 120.  PMHx:  Past Medical History  Diagnosis Date  . Lung nodule   . Asthma   . PVC's (premature ventricular contractions)   . HTN (hypertension)   . Hyperlipidemia     Past Surgical History  Procedure Laterality Date  . Total vaginal hysterectomy  2001  . Hernia repair  1998    abdominal    FAMHx:  Family History  Problem Relation Age of Onset  . Coronary artery disease Brother   . Cerebral aneurysm Sister   . Alzheimer's disease    . Hypertension    . Colon cancer Neg Hx   . Stomach cancer Neg Hx     SOCHx:   reports that she quit smoking about 13 years ago. She has never used smokeless tobacco. She reports that she drinks about 4.2 oz of alcohol per week. She reports that she does not use illicit drugs.  ALLERGIES:  No Known Allergies  ROS: A comprehensive review of systems was negative.  HOME MEDS: Current Outpatient Prescriptions  Medication Sig Dispense Refill  . estradiol (CLIMARA - DOSED IN MG/24 HR) 0.1 mg/24hr Place 1 patch onto the skin once a week.     . Fluticasone-Salmeterol (ADVAIR) 250-50 MCG/DOSE AEPB Inhale 1 puff into the lungs every 12 (twelve) hours.    Marland Kitchen atorvastatin (LIPITOR) 40 MG tablet Take 1 tablet (40 mg total) by mouth daily. 30 tablet 6  . valsartan (DIOVAN) 80 MG  tablet Take 1 tablet (80 mg total) by mouth daily. 30 tablet 6   No current facility-administered medications for this visit.    LABS/IMAGING: No results found for this or any previous visit (from the past 48 hour(s)). No results found.  VITALS: BP 143/95 mmHg  Pulse 82  Ht 5' 7.5" (1.715 m)  Wt 154 lb 11.2 oz (70.171 kg)  BMI 23.86 kg/m2  EXAM: Deferred  EKG: Deferred  ASSESSMENT: 1. Abnormal EKG 2. History of tobacco abuse 3. Hypertension 4. Family history of abdominal aortic aneurysm  and cerebral aneurysm 5. Dyslipidemia  PLAN: 1.   Mrs. Mcadams does have evidence for ascending aortic aneurysm. Given her family history she may have some element of aortopathy. There is some data that ACE inhibitors / ARB's may be helpful for this and I'm recommending based on the fact that she has elevated blood pressure, that she starts on a low-dose ARB. We'll start her on Diovan 80 mg daily. There is no evidence of abdominal aortic aneurysm however there is abdominal atherosclerosis. There was no coronary artery calcium, however concern for peripheral vascular disease, therefore would recommend aggressive lipid management. I recommend starting Lipitor 40 mg daily we'll recheck lipid profile in about 3 months. Plan to recheck a metabolic profile, comprehensive, and a couple of weeks to make sure that there is no liver enzyme abnormalities given her liver hypodensities on the statin.  Thanks as always for the kind referral.  Pixie Casino, MD, [facility] Attending Cardiologist [facility]  Emary Zalar C 07/08/2014, 6:07 PM

## 2014-07-08 NOTE — Patient Instructions (Addendum)
Your physician wants you to follow-up in: 6 months with Dr. Debara Pickett. You will receive a reminder letter in the mail two months in advance. If you don't receive a letter, please call our office to schedule the follow-up appointment.  Please have lab work in 1 week - after starting new medications.  1. Atorvastatin 40mg  once daily >> cholesterol  2. Valsartan 80mg  once daily >> BP  Your physician recommends that you return for lab work in: 3 months - fasting - to re-check cholesterol

## 2014-07-09 ENCOUNTER — Telehealth: Payer: Self-pay | Admitting: Internal Medicine

## 2014-07-09 NOTE — Telephone Encounter (Signed)
Pt called in wanting some clarity on the directions for her Atorvastatin and Valsartan medications. She would like to know if she could take one at night and the other during the day. Please f/u with pt  Thanks

## 2014-07-09 NOTE — Telephone Encounter (Signed)
Notified patient is best to take cholesterol medication in evenings/before bed. She voiced understanding. She prefers to take valsartan in AM with her Advair - no specific time she needs to take this.

## 2014-07-16 LAB — COMPREHENSIVE METABOLIC PANEL
ALT: 13 U/L (ref 0–35)
AST: 16 U/L (ref 0–37)
Albumin: 3.9 g/dL (ref 3.5–5.2)
Alkaline Phosphatase: 32 U/L — ABNORMAL LOW (ref 39–117)
BUN: 12 mg/dL (ref 6–23)
CO2: 25 mEq/L (ref 19–32)
Calcium: 9 mg/dL (ref 8.4–10.5)
Chloride: 103 mEq/L (ref 96–112)
Creat: 0.61 mg/dL (ref 0.50–1.10)
Glucose, Bld: 79 mg/dL (ref 70–99)
Potassium: 3.7 mEq/L (ref 3.5–5.3)
Sodium: 138 mEq/L (ref 135–145)
Total Bilirubin: 0.6 mg/dL (ref 0.2–1.2)
Total Protein: 6 g/dL (ref 6.0–8.3)

## 2014-10-30 LAB — LIPID PANEL
Cholesterol: 157 mg/dL (ref 0–200)
HDL: 102 mg/dL (ref >=46)
LDL Cholesterol: 47 mg/dL (ref 0–99)
Total CHOL/HDL Ratio: 1.5 Ratio
Triglycerides: 40 mg/dL (ref <150)
VLDL: 8 mg/dL (ref 0–40)

## 2015-01-13 ENCOUNTER — Encounter: Payer: Self-pay | Admitting: Internal Medicine

## 2015-01-13 ENCOUNTER — Ambulatory Visit (INDEPENDENT_AMBULATORY_CARE_PROVIDER_SITE_OTHER): Payer: 59 | Admitting: Internal Medicine

## 2015-01-13 VITALS — BP 158/102 | HR 95 | Ht 67.5 in | Wt 147.3 lb

## 2015-01-13 DIAGNOSIS — I712 Thoracic aortic aneurysm, without rupture: Secondary | ICD-10-CM

## 2015-01-13 DIAGNOSIS — E785 Hyperlipidemia, unspecified: Secondary | ICD-10-CM

## 2015-01-13 DIAGNOSIS — I493 Ventricular premature depolarization: Secondary | ICD-10-CM | POA: Diagnosis not present

## 2015-01-13 DIAGNOSIS — R9431 Abnormal electrocardiogram [ECG] [EKG]: Secondary | ICD-10-CM

## 2015-01-13 DIAGNOSIS — I10 Essential (primary) hypertension: Secondary | ICD-10-CM | POA: Diagnosis not present

## 2015-01-13 DIAGNOSIS — I7121 Aneurysm of the ascending aorta, without rupture: Secondary | ICD-10-CM

## 2015-01-13 NOTE — Patient Instructions (Signed)
Your physician has requested that you regularly monitor and record your blood pressure readings at home. Please use the same machine at the same time of day to check your readings and record them to bring to your follow-up visit.    Your physician recommends that you schedule a follow-up appointment in: 2 weeks with Erasmo Downer, PharmD for a blood pressure check.  Your physician recommends that you schedule a follow-up appointment in: 6 months with Dr. Debara Pickett.      Blood Pressure Record Sheet  BLOOD PRESSURE LOG Date: _______________________  a.m. _____________________  p.m. _____________________ Date: _______________________  a.m. _____________________  p.m. _____________________ Date: _______________________  a.m. _____________________  p.m. _____________________ Date: _______________________  a.m. _____________________  p.m. _____________________ Date: _______________________  a.m. _____________________  p.m. _____________________ Document Released: 01/21/2003 Document Revised: 09/08/2013 Document Reviewed: 06/17/2013 Exit[employer] Patient Information 2015 [place], [place]. This information is not intended to replace advice given to you by your health care provider. Make sure you discuss any questions you have with your health care provider.

## 2015-01-13 NOTE — Progress Notes (Signed)
OFFICE NOTE  Chief Complaint:   occasional chest discomfort  Primary Care Physician: Michelle Sake, MD  HPI:  Michelle Moreno is a pleasant 64 year old female coming referred to me by Michelle Moreno. Her primary care provider is Michelle Moreno. Her family history is very significant for heart disease. She tells me that her mother had a abdominal aortic aneurysm and is deceased. Her father heart disease. She also has a brother has heart disease at a young age and a sister who had a cerebral aneurysm and died. Personally she has no known coronary history. She does have a history of smoking in the past and risk factors including hypertension, age, dyslipidemia and is had PVCs. She denies any chest pain or worsening shortness of breath with exertion however is not particularly active. She is going to start doing yoga soon and wants to know if it's okay for her to start exercise. She also has concerns about her mother who had abdominal aortic aneurysm. Given her history of smoking and hypertension as well as family history she may be at risk for this. In addition, she is wondering whether she should have screening for cerebral aneurysms. Current guidelines recommend screening for cerebral aneurysms in patients who have 2 first-degree relatives with cerebral aneurysms. The incidence of cerebral aneurysm in patients with one first-degree relative is very low around 1%, therefore screening is not likely to be beneficial.  I saw Michelle Moreno back in the office today. 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 This was also noted on the radiology over read including a number of pulmonary nodules and hypodensities in the liver consistent with probably cysts. She also underwent abdominal ultrasound which was negative for any abdominal aortic aneurysm. While there was no coronary calcium  there is atherosclerosis of the abdominal aorta that was noted. Laboratory work was reviewed from her primary care provider including a total cholesterol 240 with LDL greater than 120.   Michelle Moreno returns today for follow-up. She's had some occasional episodes of discomfort in her chest at night particularly laying down. I did not suspect this is cardiac given her recent 0 coronary calcium score. She does have an ascending aortic aneurysm measuring 4.3 cm and is ordered for repeat CT of the aorta next February. Blood pressure today was very elevated in the office at 158/102. She reports significant whitecoat hypertension. She did take her valsartan and today. I rechecked her blood pressure at the end of the visit which should come down to 145/98. Still elevated.  PMHx:  Past Medical History  Diagnosis Date  . Lung nodule   . Asthma   . PVC's (premature ventricular contractions)   . HTN (hypertension)   . Hyperlipidemia     Past Surgical History  Procedure Laterality Date  . Total vaginal hysterectomy  2001  . Hernia repair  1998    abdominal    FAMHx:  Family History  Problem Relation Age of Onset  . Coronary artery disease Brother   . Cerebral aneurysm Sister   . Alzheimer's disease    . Hypertension    . Colon cancer Neg Hx   . Stomach cancer Neg Hx     SOCHx:   reports that she quit smoking about 13 years ago. She has never used smokeless tobacco. She reports that she drinks about 4.2 oz of alcohol per week. She  reports that she does not use illicit drugs.  ALLERGIES:  No Known Allergies  ROS: A comprehensive review of systems was negative.  HOME MEDS: Current Outpatient Prescriptions  Medication Sig Dispense Refill  . atorvastatin (LIPITOR) 40 MG tablet Take 1 tablet (40 mg total) by mouth daily. 30 tablet 6  . estradiol (CLIMARA - DOSED IN MG/24 HR) 0.1 mg/24hr Place 1 patch onto the skin once a week.     . Fluticasone-Salmeterol (ADVAIR) 250-50 MCG/DOSE AEPB  Inhale 1 puff into the lungs every 12 (twelve) hours.    . valsartan (DIOVAN) 80 MG tablet Take 1 tablet (80 mg total) by mouth daily. 30 tablet 6   No current facility-administered medications for this visit.    LABS/IMAGING: No results found for this or any previous visit (from the past 48 hour(s)). No results found.  VITALS: BP 158/102 mmHg  Pulse 95  Ht 5' 7.5" (1.715 m)  Wt 147 lb 4.8 oz (66.815 kg)  BMI 22.72 kg/m2  EXAM: General appearance: alert and no distress Neck: no carotid bruit and no JVD Lungs: clear to auscultation bilaterally Heart: regular rate and rhythm, S1, S2 normal, no murmur, click, rub or gallop Abdomen: soft, non-tender; bowel sounds normal; no masses,  no organomegaly Extremities: extremities normal, atraumatic, no cyanosis or edema Pulses: 2+ and symmetric Skin: Skin color, texture, turgor normal. No rashes or lesions Neurologic: Grossly normal Psych: Pleasant  EKG:  normal sinus rhythm at 95, incomplete right bundle branch pattern  ASSESSMENT: 1. Abnormal EKG 2. History of tobacco abuse 3. Hypertension 4. Family history of abdominal aortic aneurysm and cerebral aneurysm 5. Dyslipidemia  PLAN: 1.   Michelle Moreno does have evidence for ascending aortic aneurysm.  She seems to be tolerating valsartan. Blood pressure is a little more elevated today. I like her to keep blood pressure checks at home and bring in her blood pressure cuff for follow-up appointment with Michelle Moreno, our hypertension pharmacist  In 2 weeks. Her recent lipid profile shows a marked improvement with total cholesterol of 157 and LDL of 47. She should remain on her current dose of Lipitor. She scheduled for repeat CT of the aorta to re-measure her aortic aneurysm in February 2017.  Plan to see her back in 6 months.  Michelle Casino, MD, [facility] Attending Cardiologist [place] 01/13/2015, 6:25 PM

## 2015-01-28 ENCOUNTER — Telehealth: Payer: Self-pay | Admitting: Internal Medicine

## 2015-01-28 ENCOUNTER — Ambulatory Visit: Payer: 59 | Admitting: Pharmacist Clinician (PhC)/ Clinical Pharmacy Specialist

## 2015-01-28 NOTE — Telephone Encounter (Signed)
When patient got up to bathroom after lying in bed watching TV last night developed pain left side of chest under breast area.  Pain lasted for 5 minutes and it is still a little sore this morning  Has not increased activity or started any type of exercise program  Ate a lot yesterday and has been busy getting ready for the antique festival  BP last evening was 130/90 and this AM 120/80's  Also had a question about her thoracic aneurysm:  "With it at 4.3, why don't they fix it now".  Routed to Dr. Debara Pickett and Dr. Ellyn Hack DOD

## 2015-01-28 NOTE — Telephone Encounter (Signed)
I returned call to patient this afternoon.  Said the discomort is under arm pit and goes down the side to below level of breast  No shortness of breath or nausea  She did take an advil last night and it did help.  Said she feels much more comfortable if she goes without a bra.  Told her to monitor discomfort and if the character would change becoming more sharp, felt short of breath or felt worse she needed to see medical care

## 2015-01-28 NOTE — Telephone Encounter (Signed)
Pt said she had chest pains last nigh,still a little sore this morning. She wants to ask some questions.

## 2015-01-28 NOTE — Telephone Encounter (Signed)
Will defer to Dr. Debara Pickett

## 2015-01-29 NOTE — Telephone Encounter (Signed)
LVMTCB

## 2015-01-29 NOTE — Telephone Encounter (Signed)
Agree with management .Marland Kitchen Sounds like musculoskeletal chest pain. Agree with using anti-inflammatories.  The reason not to fix the aneurysm is that it may be stable and not grow for years - why have a surgery if it is not indicated? We know the risk of rupture and death does not significantly increase until it is around 5.5-6 cm.  Dr. Lemmie Evens

## 2015-01-29 NOTE — Telephone Encounter (Signed)
Patient had many questions bout aneurysm.  Explained Dr. Lysbeth Penner reasoning. Understands.  Thankful for the time.  Also told her it was ok to use antiinflammatories for discomfort

## 2015-02-02 ENCOUNTER — Other Ambulatory Visit: Payer: Self-pay | Admitting: Internal Medicine

## 2015-02-03 NOTE — Telephone Encounter (Signed)
Rx(s) sent to pharmacy electronically.  

## 2015-02-04 ENCOUNTER — Ambulatory Visit: Payer: 59 | Admitting: Pharmacist Clinician (PhC)/ Clinical Pharmacy Specialist

## 2015-02-18 ENCOUNTER — Ambulatory Visit: Payer: 59 | Admitting: Pharmacist Clinician (PhC)/ Clinical Pharmacy Specialist

## 2015-06-04 ENCOUNTER — Ambulatory Visit
Admission: RE | Admit: 2015-06-04 | Discharge: 2015-06-04 | Disposition: A | Discharge status: Home / Self Care | Payer: BLUE CROSS/BLUE SHIELD | Source: Ambulatory Visit | Attending: Internal Medicine | Admitting: Internal Medicine

## 2015-06-04 DIAGNOSIS — I77810 Thoracic aortic ectasia: Secondary | ICD-10-CM

## 2015-06-04 MED ORDER — IOPAMIDOL (ISOVUE-370) INJECTION 76%
100.0000 mL | Freq: Once | INTRAVENOUS | Status: AC | PRN
  Administered 2015-06-04: 100 mL via INTRAVENOUS

## 2016-01-11 ENCOUNTER — Other Ambulatory Visit: Payer: Self-pay | Admitting: Internal Medicine

## 2016-01-11 NOTE — Telephone Encounter (Signed)
Rx(s) sent to pharmacy electronically.  

## 2016-01-17 DIAGNOSIS — Z9181 History of falling: Secondary | ICD-10-CM | POA: Diagnosis not present

## 2016-01-17 DIAGNOSIS — J45909 Unspecified asthma, uncomplicated: Secondary | ICD-10-CM | POA: Diagnosis not present

## 2016-01-17 DIAGNOSIS — Z124 Encounter for screening for malignant neoplasm of cervix: Secondary | ICD-10-CM | POA: Diagnosis not present

## 2016-01-17 DIAGNOSIS — Z1389 Encounter for screening for other disorder: Secondary | ICD-10-CM | POA: Diagnosis not present

## 2016-01-17 DIAGNOSIS — I712 Thoracic aortic aneurysm, without rupture: Secondary | ICD-10-CM | POA: Diagnosis not present

## 2016-01-17 DIAGNOSIS — Z6823 Body mass index (BMI) 23.0-23.9, adult: Secondary | ICD-10-CM | POA: Diagnosis not present

## 2016-01-17 DIAGNOSIS — L989 Disorder of the skin and subcutaneous tissue, unspecified: Secondary | ICD-10-CM | POA: Diagnosis not present

## 2016-01-19 ENCOUNTER — Telehealth: Payer: Self-pay | Admitting: Internal Medicine

## 2016-01-19 NOTE — Telephone Encounter (Signed)
Received records from [facility] for appointment on 02/23/16 with Dr Debara Pickett.  Records given to [facility] (medical records) for Dr [facility] schedule on 02/23/16. lp

## 2016-02-10 ENCOUNTER — Other Ambulatory Visit: Payer: Self-pay | Admitting: Internal Medicine

## 2016-02-23 ENCOUNTER — Ambulatory Visit (INDEPENDENT_AMBULATORY_CARE_PROVIDER_SITE_OTHER): Payer: Medicare HMO | Admitting: Internal Medicine

## 2016-02-23 ENCOUNTER — Encounter: Payer: Self-pay | Admitting: Internal Medicine

## 2016-02-23 VITALS — BP 139/88 | HR 84 | Ht 67.5 in | Wt 145.4 lb

## 2016-02-23 DIAGNOSIS — F17200 Nicotine dependence, unspecified, uncomplicated: Secondary | ICD-10-CM

## 2016-02-23 DIAGNOSIS — I10 Essential (primary) hypertension: Secondary | ICD-10-CM

## 2016-02-23 DIAGNOSIS — I712 Thoracic aortic aneurysm, without rupture: Secondary | ICD-10-CM

## 2016-02-23 DIAGNOSIS — I7121 Aneurysm of the ascending aorta, without rupture: Secondary | ICD-10-CM

## 2016-02-23 DIAGNOSIS — I77810 Thoracic aortic ectasia: Secondary | ICD-10-CM

## 2016-02-23 DIAGNOSIS — E785 Hyperlipidemia, unspecified: Secondary | ICD-10-CM

## 2016-02-23 NOTE — Patient Instructions (Addendum)
Medication Instructions:  Your physician recommends that you continue on your current medications as directed. Please refer to the Current Medication list given to you today.  Labwork: None   Testing/Procedures: Repeat Angio Chest Aorta W/CM &/OR W/O /CM  [place], [place] [id number]  [phone]  Follow-Up: Your physician wants you to follow-up in: 12 months with Dr Debara Pickett. You will receive a reminder letter in the mail two months in advance. If you don't receive a letter, please call our office to schedule the follow-up appointment.   Any Other Special Instructions Will Be Listed Below (If Applicable).     If you need a refill on your cardiac medications before your next appointment, please call your pharmacy.

## 2016-02-23 NOTE — Progress Notes (Signed)
OFFICE NOTE  Chief Complaint:  Follow-up aneurysm  Primary Care Physician: Leonides Sake, MD  HPI:  Michelle Moreno is a pleasant 65 year old female coming referred to me by Dr. Edwyna Ready. Her primary care provider is Dr. Lisbeth Ply. Her family history is very significant for heart disease. She tells me that her mother had a abdominal aortic aneurysm and is deceased. Her father heart disease. She also has a brother has heart disease at a young age and a sister who had a cerebral aneurysm and died. Personally she has no known coronary history. She does have a history of smoking in the past and risk factors including hypertension, age, dyslipidemia and is had PVCs. She denies any chest pain or worsening shortness of breath with exertion however is not particularly active. She is going to start doing yoga soon and wants to know if it's okay for her to start exercise. She also has concerns about her mother who had abdominal aortic aneurysm. Given her history of smoking and hypertension as well as family history she may be at risk for this. In addition, she is wondering whether she should have screening for cerebral aneurysms. Current guidelines recommend screening for cerebral aneurysms in patients who have 2 first-degree relatives with cerebral aneurysms. The incidence of cerebral aneurysm in patients with one first-degree relative is very low around 1%, therefore screening is not likely to be beneficial.  I saw [name] back in the office today. 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 This was also noted on the radiology over read including a number of pulmonary nodules and hypodensities in the liver consistent with probably cysts. She also underwent abdominal ultrasound which was negative for any abdominal aortic aneurysm. While there was no coronary calcium there is  atherosclerosis of the abdominal aorta that was noted. Laboratory work was reviewed from her primary care provider including a total cholesterol 240 with LDL greater than 120.   Mrs. Age returns today for follow-up. She's had some occasional episodes of discomfort in her chest at night particularly laying down. I did not suspect this is cardiac given her recent 0 coronary calcium score. She does have an ascending aortic aneurysm measuring 4.3 cm and is ordered for repeat CT of the aorta next February. Blood pressure today was very elevated in the office at 158/102. She reports significant whitecoat hypertension. She did take her valsartan and today. I rechecked her blood pressure at the end of the visit which should come down to 145/98. Still elevated.  02/23/2016  [name] returns today for follow-up of her ascending aortic aneurysm. She underwent another CT scan of her aorta in January of this year. This demonstrated an ectatic/bordering on aneurysmal ascending thoracic aorta with a maximum diameter 3.9 cm. The aortic root was measured at 3.4 cm without effacement at the sinotubular junction. This contrasts an earlier CT finding of a dilated root to 4.3 cm. This is cause [name] significant anxiety. I tried to explain the results to her today and the fact that there is some discrepancy on the CT findings may be related to operator air in measurement. The good news is that the descending aorta is now measuring smaller, but not normal. We will need additional data points to understand whether there has been any significant changes in the size of the vessel.  PMHx:  Past Medical History:  Diagnosis Date  . Asthma   . HTN (hypertension)   . Hyperlipidemia   . Lung nodule   . PVC's (premature ventricular contractions)     Past Surgical History:  Procedure Laterality Date  . HERNIA REPAIR  1998   abdominal  . TOTAL VAGINAL HYSTERECTOMY  2001    FAMHx:  Family History  Problem  Relation Age of Onset  . Coronary artery disease Brother   . Cerebral aneurysm Sister   . Alzheimer's disease    . Hypertension    . Colon cancer Neg Hx   . Stomach cancer Neg Hx     SOCHx:   reports that she quit smoking about 14 years ago. She has never used smokeless tobacco. She reports that she drinks about 4.2 oz of alcohol per week . She reports that she does not use drugs.  ALLERGIES:  No Known Allergies  ROS: A comprehensive review of systems was negative.  HOME MEDS: Current Outpatient Prescriptions  Medication Sig Dispense Refill  . atorvastatin (LIPITOR) 40 MG tablet Take 1 tablet (40 mg total) by mouth daily. 30 tablet 11  . estradiol (CLIMARA - DOSED IN MG/24 HR) 0.1 mg/24hr Place 1 patch onto the skin once a week.     . fluticasone (FLONASE) 50 MCG/ACT nasal spray Place 2 sprays into both nostrils daily.    . Fluticasone-Salmeterol (ADVAIR) 250-50 MCG/DOSE AEPB Inhale 1 puff into the lungs every 12 (twelve) hours.    Marland Kitchen PROAIR HFA 108 (90 Base) MCG/ACT inhaler Inhale two (2) puffs into the lungs every four (4) to six (6) hours as needed for shortness of breath or wheezing.  3  . valsartan (DIOVAN) 80 MG tablet Take 1 tablet (80 mg total) by mouth daily. 30 tablet 11   No current facility-administered medications for this visit.     LABS/IMAGING: No results found for this or any previous visit (from the past 48 hour(s)). No results found.  VITALS: BP 139/88   Pulse 84   Ht 5' 7.5" (1.715 m)   Wt 145 lb 6.4 oz (66 kg)   BMI 22.44 kg/m   EXAM: General appearance: alert and no distress Neck: no carotid bruit and no JVD Lungs: clear to auscultation bilaterally Heart: regular rate and rhythm, S1, S2 normal, no murmur, click, rub or gallop Abdomen: soft, non-tender; bowel sounds normal; no masses,  no organomegaly Extremities: extremities normal, atraumatic, no cyanosis or edema Pulses: 2+ and symmetric Skin: Skin color, texture, turgor normal. No rashes or  lesions Neurologic: Grossly normal Psych: Pleasant  EKG:  normal sinus rhythm at 84, incomplete right bundle branch block  ASSESSMENT: 1. Ectatic or borderline aneurysmal ascending aorta between 3.9 and 4.3 cm 2. Abnormal EKG 3. History of tobacco abuse 4. Hypertension 5. Family history of abdominal aortic aneurysm and cerebral aneurysm 6. Dyslipidemia  PLAN: 1.   Mrs. Lane has conflicting findings on her CT scans. We'll need to repeat her CT scan in the beginning of next year to help determine the actual size of her aorta. Blood pressure appears well controlled today. Her weight is at goal and she is on cholesterol medication. We'll continue aggressive risk factor modification and plan to see her back annually or sooner as necessary.  Pixie Casino, MD, [facility] Attending Cardiologist [place] 02/23/2016, 6:52 PM

## 2016-02-28 DIAGNOSIS — Z01419 Encounter for gynecological examination (general) (routine) without abnormal findings: Secondary | ICD-10-CM | POA: Diagnosis not present

## 2016-02-28 DIAGNOSIS — Z1231 Encounter for screening mammogram for malignant neoplasm of breast: Secondary | ICD-10-CM | POA: Diagnosis not present

## 2016-03-01 ENCOUNTER — Other Ambulatory Visit: Payer: Self-pay | Admitting: Obstetrics and Gynecology

## 2016-03-01 DIAGNOSIS — R928 Other abnormal and inconclusive findings on diagnostic imaging of breast: Secondary | ICD-10-CM

## 2016-03-06 ENCOUNTER — Ambulatory Visit
Admission: RE | Admit: 2016-03-06 | Discharge: 2016-03-06 | Disposition: A | Discharge status: Home / Self Care | Payer: Medicare HMO | Source: Ambulatory Visit | Attending: Obstetrics and Gynecology | Admitting: Obstetrics and Gynecology

## 2016-03-06 DIAGNOSIS — R928 Other abnormal and inconclusive findings on diagnostic imaging of breast: Secondary | ICD-10-CM

## 2016-03-06 DIAGNOSIS — R922 Inconclusive mammogram: Secondary | ICD-10-CM | POA: Diagnosis not present

## 2016-03-06 DIAGNOSIS — N6012 Diffuse cystic mastopathy of left breast: Secondary | ICD-10-CM | POA: Diagnosis not present

## 2016-04-24 DIAGNOSIS — I8393 Asymptomatic varicose veins of bilateral lower extremities: Secondary | ICD-10-CM | POA: Diagnosis not present

## 2016-04-24 DIAGNOSIS — C44319 Basal cell carcinoma of skin of other parts of face: Secondary | ICD-10-CM | POA: Diagnosis not present

## 2016-04-24 DIAGNOSIS — D485 Neoplasm of uncertain behavior of skin: Secondary | ICD-10-CM | POA: Diagnosis not present

## 2016-04-24 DIAGNOSIS — Z8709 Personal history of other diseases of the respiratory system: Secondary | ICD-10-CM | POA: Diagnosis not present

## 2016-04-24 DIAGNOSIS — I10 Essential (primary) hypertension: Secondary | ICD-10-CM | POA: Diagnosis not present

## 2016-05-05 DIAGNOSIS — R69 Illness, unspecified: Secondary | ICD-10-CM | POA: Diagnosis not present

## 2016-05-15 ENCOUNTER — Ambulatory Visit (INDEPENDENT_AMBULATORY_CARE_PROVIDER_SITE_OTHER): Payer: Medicare HMO | Admitting: Vascular Surgery

## 2016-05-15 ENCOUNTER — Encounter (INDEPENDENT_AMBULATORY_CARE_PROVIDER_SITE_OTHER): Payer: Self-pay | Admitting: Vascular Surgery

## 2016-05-15 VITALS — BP 132/88 | HR 72 | Resp 17 | Ht 67.5 in | Wt 149.0 lb

## 2016-05-15 DIAGNOSIS — I83813 Varicose veins of bilateral lower extremities with pain: Secondary | ICD-10-CM

## 2016-05-15 DIAGNOSIS — M79604 Pain in right leg: Secondary | ICD-10-CM

## 2016-05-15 DIAGNOSIS — I872 Venous insufficiency (chronic) (peripheral): Secondary | ICD-10-CM | POA: Diagnosis not present

## 2016-05-15 DIAGNOSIS — M7989 Other specified soft tissue disorders: Secondary | ICD-10-CM | POA: Diagnosis not present

## 2016-05-15 DIAGNOSIS — I712 Thoracic aortic aneurysm, without rupture: Secondary | ICD-10-CM

## 2016-05-15 DIAGNOSIS — I83893 Varicose veins of bilateral lower extremities with other complications: Secondary | ICD-10-CM | POA: Insufficient documentation

## 2016-05-15 DIAGNOSIS — I10 Essential (primary) hypertension: Secondary | ICD-10-CM | POA: Diagnosis not present

## 2016-05-15 DIAGNOSIS — M79605 Pain in left leg: Secondary | ICD-10-CM | POA: Diagnosis not present

## 2016-05-15 DIAGNOSIS — I7121 Aneurysm of the ascending aorta, without rupture: Secondary | ICD-10-CM

## 2016-05-15 DIAGNOSIS — M79606 Pain in leg, unspecified: Secondary | ICD-10-CM | POA: Insufficient documentation

## 2016-05-15 NOTE — Progress Notes (Signed)
MRN : [id number]  Michelle Moreno is a 66 y.o. (Apr 15, 1951) female who presents with chief complaint of  Chief Complaint  Patient presents with  . New Patient (Initial Visit)  .  History of Present Illness: The patient is seen for evaluation of symptomatic varicose veins. The patient relates burning and stinging which worsened steadily throughout the course of the day, particularly with standing. The patient also notes an aching and throbbing pain over the varicosities, particularly with prolonged dependent positions. The symptoms are significantly improved with elevation.  The patient also notes that during hot weather the symptoms are greatly intensified. The patient states the pain from the varicose veins interferes with work, daily exercise, shopping and household maintenance. At this point, the symptoms are persistent and severe enough that they're having a negative impact on lifestyle and are interfering with daily activities.  There is no history of DVT, PE or superficial thrombophlebitis. There is no history of ulceration or hemorrhage. The patient denies a significant family history of varicose veins.   The patient has not worn graduated compression in the past. At the present time the patient has not been using over-the-counter analgesics. There is no history of prior surgical intervention or sclerotherapy.    Current Meds  Medication Sig  . atorvastatin (LIPITOR) 40 MG tablet Take 1 tablet (40 mg total) by mouth daily.  Marland Kitchen estradiol (CLIMARA - DOSED IN MG/24 HR) 0.1 mg/24hr Place 1 patch onto the skin once a week.   . fluticasone (FLONASE) 50 MCG/ACT nasal spray Place 2 sprays into both nostrils daily.  . Fluticasone-Salmeterol (ADVAIR) 250-50 MCG/DOSE AEPB Inhale 1 puff into the lungs every 12 (twelve) hours.  Marland Kitchen PROAIR HFA 108 (90 Base) MCG/ACT inhaler Inhale two (2) puffs into the lungs every four (4) to six (6) hours as needed for shortness of breath or wheezing.  .  valsartan (DIOVAN) 80 MG tablet Take 1 tablet (80 mg total) by mouth daily.    Past Medical History:  Diagnosis Date  . Asthma   . HTN (hypertension)   . Hyperlipidemia   . Lung nodule   . PVC's (premature ventricular contractions)     Past Surgical History:  Procedure Laterality Date  . HERNIA REPAIR  1998   abdominal  . TOTAL VAGINAL HYSTERECTOMY  2001    Social History Social History  Substance Use Topics  . Smoking status: Former Smoker    Quit date: 05/08/2001  . Smokeless tobacco: Never Used  . Alcohol use 4.2 oz/week    7 Glasses of wine per week    Family History Family History  Problem Relation Age of Onset  . Coronary artery disease Brother   . Cerebral aneurysm Sister   . Alzheimer's disease    . Hypertension    . Colon cancer Neg Hx   . Stomach cancer Neg Hx   No family history of bleeding/clotting disorders, porphyria or autoimmune disease   No Known Allergies   REVIEW OF SYSTEMS (Negative unless checked)  Constitutional: [] Weight loss  [] Fever  [] Chills Cardiac: [] Chest pain   [] Chest pressure   [] Palpitations   [] Shortness of breath when laying flat   [] Shortness of breath with exertion. Vascular:  [] Pain in legs with walking   [] Pain in legs at rest  [] History of DVT   [] Phlebitis   [x] Swelling in legs   [x] Varicose veins   [] Non-healing ulcers Pulmonary:   [] Uses home oxygen   [] Productive cough   [] Hemoptysis   [] Wheeze  []   COPD   [] Asthma Neurologic:  [] Dizziness   [] Seizures   [] History of stroke   [] History of TIA  [] Aphasia   [] Vissual changes   [] Weakness or numbness in arm   [] Weakness or numbness in leg Musculoskeletal:   [] Joint swelling   [] Joint pain   [] Low back pain Hematologic:  [] Easy bruising  [] Easy bleeding   [] Hypercoagulable state   [] Anemic Gastrointestinal:  [] Diarrhea   [] Vomiting  [] Gastroesophageal reflux/heartburn   [] Difficulty swallowing. Genitourinary:  [] Chronic kidney disease   [] Difficult urination  [] Frequent  urination   [] Blood in urine Skin:  [] Rashes   [] Ulcers  Psychological:  [] History of anxiety   []  History of major depression.  Physical Examination  Vitals:   05/15/16 1454  BP: 132/88  Pulse: 72  Resp: 17  Weight: 149 lb (67.6 kg)  Height: 5' 7.5" (1.715 m)   Body mass index is 22.99 kg/m. Gen: WD/WN, NAD Head: [place]/AT, No temporalis wasting.  Ear/Nose/Throat: Hearing grossly intact, nares w/o erythema or drainage, poor dentition Eyes: PER, EOMI, sclera nonicteric.  Neck: Supple, no masses.  No bruit or JVD.  Pulmonary:  Good air movement, clear to auscultation bilaterally, no use of accessory muscles.  Cardiac: RRR, normal S1, S2, no Murmurs. Vascular:  Bilateral varicosities, >8 mm, mild venous stasis changes bilaterally 2+ edema Vessel Right Left  Radial Palpable Palpable  Ulnar Palpable Palpable  Brachial Palpable Palpable  Carotid Palpable Palpable  Femoral Palpable Palpable  Popliteal Palpable Palpable  PT Palpable Palpable  DP Palpable Palpable   Gastrointestinal: soft, non-distended. No guarding/no peritoneal signs.  Musculoskeletal: M/S 5/5 throughout.  No deformity or atrophy.  Neurologic: CN 2-12 intact. Pain and light touch intact in extremities.  Symmetrical.  Speech is fluent. Motor exam as listed above. Psychiatric: Judgment intact, Mood & affect appropriate for pt's clinical situation. Dermatologic: No rashes or ulcers noted.  No changes consistent with cellulitis. Lymph : No Cervical lymphadenopathy, no lichenification or skin changes of chronic lymphedema.  CBC No results found for: WBC, HGB, HCT, MCV, PLT  BMET    Component Value Date/Time   NA 138 07/15/2014 1352   K 3.7 07/15/2014 1352   CL 103 07/15/2014 1352   CO2 25 07/15/2014 1352   GLUCOSE 79 07/15/2014 1352   BUN 12 07/15/2014 1352   CREATININE 0.61 07/15/2014 1352   CALCIUM 9.0 07/15/2014 1352   CrCl cannot be calculated (Patient's most recent lab result is older than the maximum 21  days allowed.).  COAG No results found for: INR, PROTIME  Radiology No results found.  Assessment/Plan 1. Varicose veins of both lower extremities with pain  Recommend:  The patient has large symptomatic varicose veins that are painful and associated with swelling.  I have had a long discussion with the patient regarding  varicose veins and why they cause symptoms.  Patient will begin wearing graduated compression stockings class 1 on a daily basis, beginning first thing in the morning and removing them in the evening. The patient is instructed specifically not to sleep in the stockings.    The patient  will also begin using over-the-counter analgesics such as Motrin 600 mg po TID to help control the symptoms.    In addition, behavioral modification including elevation during the day will be initiated.    Pending the results of these changes the  patient will be reevaluated in three months.   An  ultrasound of the venous system will be obtained.   Further plans will be based  on the ultrasound results and whether conservative therapies are successful at eliminating the pain and swelling.    2. Chronic venous insufficiency No surgery or intervention at this point in time.    I have had a long discussion with the patient regarding venous insufficiency and why it  causes symptoms. I have discussed with the patient the chronic skin changes that accompany venous insufficiency and the long term sequela such as infection and ulceration.  Patient will begin wearing graduated compression stockings class 1 (20-30 mmHg) or compression wraps on a daily basis a prescription was given. The patient will put the stockings on first thing in the morning and removing them in the evening. The patient is instructed specifically not to sleep in the stockings.    In addition, behavioral modification including several periods of elevation of the lower extremities during the day will be continued. I have  demonstrated that proper elevation is a position with the ankles at heart level.  The patient is instructed to begin routine exercise, especially walking on a daily basis  Patient should undergo duplex ultrasound of the venous system to ensure that DVT or reflux is not present.  Following the review of the ultrasound the patient will follow up in 2-3 months to reassess the degree of swelling and the control that graduated compression stockings or compression wraps  is offering.   The patient can be assessed for a Lymph Pump at that time  3. Pain in both lower extremities See #1  4. Leg swelling See #2  5. Ascending aortic aneurysm ([place]) Followed by cardiology  6. Essential hypertension Continue antihypertensive medications as already ordered, these medications have been reviewed and there are no changes at this time.     Hortencia Pilar, MD  05/15/2016 9:13 PM

## 2016-05-29 ENCOUNTER — Other Ambulatory Visit: Payer: Self-pay | Admitting: Internal Medicine

## 2016-05-29 ENCOUNTER — Telehealth: Payer: Self-pay | Admitting: Internal Medicine

## 2016-05-29 NOTE — Telephone Encounter (Signed)
Disregard 05-29-16 phone note w/[employer] auth.  Incorrect pt.

## 2016-05-29 NOTE — Telephone Encounter (Signed)
New message   [facility] imaging calling for prier authorization for CT A chest aorta with/without contrast code [id number]

## 2016-05-29 NOTE — Telephone Encounter (Signed)
05-29-16 [employer] [id number] EXP 08-27-16 [facility]

## 2016-05-30 ENCOUNTER — Other Ambulatory Visit: Payer: Medicare HMO

## 2016-06-02 DIAGNOSIS — J069 Acute upper respiratory infection, unspecified: Secondary | ICD-10-CM | POA: Diagnosis not present

## 2016-06-02 NOTE — Telephone Encounter (Signed)
New Message     Has a bad cold, what can she take for couch and congestion that will not react with her heart medication, that will not aneursym

## 2016-06-02 NOTE — Telephone Encounter (Signed)
Spoke to patient and advised her to take heart safe meds such as zyrtec or claritin for sinus congestion, coricidin for other symptoms. Advised her to avoid decongestants such as mucinex. She reports "really bad cough". I advised seeing pcp for eval to ensure that she is treated appropriately and given antibotics, steroids, and/or cough meds as determined by PCP. She voiced understanding and thanks for call.

## 2016-06-06 ENCOUNTER — Other Ambulatory Visit: Payer: Medicare HMO

## 2016-06-08 ENCOUNTER — Other Ambulatory Visit: Payer: Medicare HMO

## 2016-06-13 DIAGNOSIS — C44319 Basal cell carcinoma of skin of other parts of face: Secondary | ICD-10-CM | POA: Diagnosis not present

## 2016-06-15 DIAGNOSIS — C44319 Basal cell carcinoma of skin of other parts of face: Secondary | ICD-10-CM | POA: Diagnosis not present

## 2016-06-20 DIAGNOSIS — C44319 Basal cell carcinoma of skin of other parts of face: Secondary | ICD-10-CM | POA: Diagnosis not present

## 2016-06-30 ENCOUNTER — Ambulatory Visit
Admission: RE | Admit: 2016-06-30 | Discharge: 2016-06-30 | Disposition: A | Discharge status: Home / Self Care | Payer: Medicare HMO | Source: Ambulatory Visit | Attending: Internal Medicine | Admitting: Internal Medicine

## 2016-06-30 DIAGNOSIS — I712 Thoracic aortic aneurysm, without rupture: Secondary | ICD-10-CM | POA: Diagnosis not present

## 2016-06-30 DIAGNOSIS — I7121 Aneurysm of the ascending aorta, without rupture: Secondary | ICD-10-CM

## 2016-06-30 MED ORDER — IOPAMIDOL (ISOVUE-300) INJECTION 61%
80.0000 mL | Freq: Once | INTRAVENOUS | Status: AC | PRN
  Administered 2016-06-30: 80 mL via INTRAVENOUS

## 2016-07-04 ENCOUNTER — Telehealth: Payer: Self-pay | Admitting: Internal Medicine

## 2016-07-04 NOTE — Telephone Encounter (Signed)
Patient called with CT results.

## 2016-07-04 NOTE — Telephone Encounter (Signed)
New message   Pt calling back for test results. Requests a call back.

## 2016-08-17 ENCOUNTER — Ambulatory Visit (INDEPENDENT_AMBULATORY_CARE_PROVIDER_SITE_OTHER): Payer: Medicare HMO | Admitting: Vascular Surgery

## 2016-08-17 ENCOUNTER — Encounter (INDEPENDENT_AMBULATORY_CARE_PROVIDER_SITE_OTHER): Payer: Medicare HMO

## 2016-12-14 ENCOUNTER — Encounter (INDEPENDENT_AMBULATORY_CARE_PROVIDER_SITE_OTHER): Payer: Medicare HMO

## 2016-12-14 ENCOUNTER — Ambulatory Visit (INDEPENDENT_AMBULATORY_CARE_PROVIDER_SITE_OTHER): Payer: Medicare HMO | Admitting: Vascular Surgery

## 2016-12-28 ENCOUNTER — Telehealth: Payer: Self-pay | Admitting: Internal Medicine

## 2016-12-28 NOTE — Telephone Encounter (Signed)
New Message  Pt c/o medication issue:  1. Name of Medication: Valsartan  2. How are you currently taking this medication (dosage and times per day)? 80mg    3. Are you having a reaction (difficulty breathing--STAT)? No  4. What is your medication issue? recall

## 2016-12-29 MED ORDER — IRBESARTAN 75 MG PO TABS: 75.0000 mg | ORAL_TABLET | Freq: Every day | ORAL | 3 refills | Status: DC

## 2016-12-29 NOTE — Telephone Encounter (Signed)
S/w pt start Irbesartan 75mg  as directed by [facility] current memo. Patient has home BP cuff, will take BP at home every 2-3 days for 2 weeks to ensure no significant changes in BP. Pt  will call back with any BP changes -or- s/e for further direction. Refill sent to pharmacy as requested.

## 2016-12-29 NOTE — Telephone Encounter (Signed)
New Message    She is out of medication what is Dr Debara Pickett switching it to?

## 2017-01-29 DIAGNOSIS — R300 Dysuria: Secondary | ICD-10-CM | POA: Diagnosis not present

## 2017-01-29 DIAGNOSIS — N390 Urinary tract infection, site not specified: Secondary | ICD-10-CM | POA: Diagnosis not present

## 2017-01-29 DIAGNOSIS — R1084 Generalized abdominal pain: Secondary | ICD-10-CM | POA: Diagnosis not present

## 2017-02-05 DIAGNOSIS — I10 Essential (primary) hypertension: Secondary | ICD-10-CM | POA: Diagnosis not present

## 2017-02-05 DIAGNOSIS — Z23 Encounter for immunization: Secondary | ICD-10-CM | POA: Diagnosis not present

## 2017-02-05 DIAGNOSIS — R1900 Intra-abdominal and pelvic swelling, mass and lump, unspecified site: Secondary | ICD-10-CM | POA: Diagnosis not present

## 2017-02-05 DIAGNOSIS — Z1389 Encounter for screening for other disorder: Secondary | ICD-10-CM | POA: Diagnosis not present

## 2017-02-05 DIAGNOSIS — J4520 Mild intermittent asthma, uncomplicated: Secondary | ICD-10-CM | POA: Diagnosis not present

## 2017-02-05 DIAGNOSIS — I712 Thoracic aortic aneurysm, without rupture: Secondary | ICD-10-CM | POA: Diagnosis not present

## 2017-02-05 DIAGNOSIS — E2839 Other primary ovarian failure: Secondary | ICD-10-CM | POA: Diagnosis not present

## 2017-02-05 DIAGNOSIS — Z9181 History of falling: Secondary | ICD-10-CM | POA: Diagnosis not present

## 2017-02-09 ENCOUNTER — Other Ambulatory Visit: Payer: Self-pay | Admitting: Family Medicine

## 2017-02-09 DIAGNOSIS — R5381 Other malaise: Secondary | ICD-10-CM

## 2017-02-12 ENCOUNTER — Ambulatory Visit (INDEPENDENT_AMBULATORY_CARE_PROVIDER_SITE_OTHER): Payer: Medicare HMO | Admitting: Internal Medicine

## 2017-02-12 VITALS — BP 140/94 | HR 74 | Ht 67.25 in | Wt 143.0 lb

## 2017-02-12 DIAGNOSIS — I10 Essential (primary) hypertension: Secondary | ICD-10-CM

## 2017-02-12 DIAGNOSIS — I872 Venous insufficiency (chronic) (peripheral): Secondary | ICD-10-CM

## 2017-02-12 DIAGNOSIS — I809 Phlebitis and thrombophlebitis of unspecified site: Secondary | ICD-10-CM | POA: Diagnosis not present

## 2017-02-12 DIAGNOSIS — I83893 Varicose veins of bilateral lower extremities with other complications: Secondary | ICD-10-CM

## 2017-02-12 DIAGNOSIS — E785 Hyperlipidemia, unspecified: Secondary | ICD-10-CM

## 2017-02-12 LAB — LIPID PANEL
Chol/HDL Ratio: 1.9 ratio (ref 0.0–4.4)
Cholesterol, Total: 153 mg/dL (ref 100–199)
HDL: 81 mg/dL (ref >39)
LDL Calculated: 55 mg/dL (ref 0–99)
Triglycerides: 86 mg/dL (ref 0–149)
VLDL Cholesterol Cal: 17 mg/dL (ref 5–40)

## 2017-02-12 NOTE — Patient Instructions (Addendum)
Your physician recommends that you return for lab work FASTING to check cholesterol.   Dr. Debara Pickett has ordered a bilateral venous insufficiency study.  This is done at [facility] [place], [place] [address]  Your physician wants you to follow-up in: ONE YEAR with Dr. Debara Pickett. You will receive a reminder letter in the mail two months in advance. If you don't receive a letter, please call our office to schedule the follow-up appointment.

## 2017-02-12 NOTE — Progress Notes (Signed)
Grossly normal   OFFICE NOTE  Chief Complaint:  Leg discomfort and varicose vein  Primary Care Physician: Isaias Sakai, DO  HPI:  Michelle Moreno is a pleasant 66 year old female coming referred to me by Dr. Edwyna Ready. Her primary care provider is Dr. Lisbeth Ply. Her family history is very significant for heart disease. She tells me that her mother had a abdominal aortic aneurysm and is deceased. Her father heart disease. She also has a brother has heart disease at a young age and a sister who had a cerebral aneurysm and died. Personally she has no known coronary history. She does have a history of smoking in the past and risk factors including hypertension, age, dyslipidemia and is had PVCs. She denies any chest pain or worsening shortness of breath with exertion however is not particularly active. She is going to start doing yoga soon and wants to know if it's okay for her to start exercise. She also has concerns about her mother who had abdominal aortic aneurysm. Given her history of smoking and hypertension as well as family history she may be at risk for this. In addition, she is wondering whether she should have screening for cerebral aneurysms. Current guidelines recommend screening for cerebral aneurysms in patients who have 2 first-degree relatives with cerebral aneurysms. The incidence of cerebral aneurysm in patients with one first-degree relative is very low around 1%, therefore screening is not likely to be beneficial.  I saw Michelle Moreno back in the office today. 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 This was also noted on the radiology over read including a number of pulmonary nodules and hypodensities in the liver consistent with probably cysts. She also underwent abdominal ultrasound which was negative for any abdominal aortic aneurysm. While  there was no coronary calcium there is atherosclerosis of the abdominal aorta that was noted. Laboratory work was reviewed from her primary care provider including a total cholesterol 240 with LDL greater than 120.   Michelle Moreno returns today for follow-up. She's had some occasional episodes of discomfort in her chest at night particularly laying down. I did not suspect this is cardiac given her recent 0 coronary calcium score. She does have an ascending aortic aneurysm measuring 4.3 cm and is ordered for repeat CT of the aorta next February. Blood pressure today was very elevated in the office at 158/102. She reports significant whitecoat hypertension. She did take her valsartan and today. I rechecked her blood pressure at the end of the visit which should come down to 145/98. Still elevated.  02/23/2016  Michelle Moreno returns today for follow-up of her ascending aortic aneurysm. She underwent another CT scan of her aorta in January of this year. This demonstrated an ectatic/bordering on aneurysmal ascending thoracic aorta with a maximum diameter 3.9 cm. The aortic root was measured at 3.4 cm without effacement at the sinotubular junction. This contrasts an earlier CT finding of a dilated root to 4.3 cm. This is cause Michelle Moreno significant anxiety. I tried to explain the results to her today and the fact that there is some discrepancy on the CT findings may be related to operator air in measurement. The good news is that the descending aorta is now measuring smaller, but not normal. We will need additional data points to understand whether there has been any significant changes in the size of the vessel.  02/12/2017  Michelle Moreno was seen today in follow-up. In February this year she had a repeat CT scan of her aorta which showed a stable ectatic aortic root measuring 3.9 cm. She had a number of small subcentimeter nodules which were stable compared to the CT scan finding in 2007 and are considered  benign by the radiologist. He denies any worsening shortness of breath. She gets some occasional chest discomfort in the left axillary area. This is not worse with exertion or relieved by rest and is more notable oftentimes at night while laying in bed. She is noted that she gets discomfort in her legs. Sometimes with walking or laying in bed at night. She gets achiness and restlessness. She also has some prominent varicose veins of the left anterior thigh and sounds like she may have had some phlebitis associated with this with burning and tingling as well as swelling. She saw vein specialist in [place] who felt that she could have a treatment for this but wanted her to discuss with me about that cardiac implications of ablating veins in her legs. We discussed this today and in fact I informed her that if she has varicose veins, they would not be considered to use as conduits for example for coronary bypass at any point in the future. She has tried stocking use but has been intolerant of them.  PMHx:  Past Medical History:  Diagnosis Date  . Asthma   . HTN (hypertension)   . Hyperlipidemia   . Lung nodule   . PVC's (premature ventricular contractions)     Past Surgical History:  Procedure Laterality Date  . HERNIA REPAIR  1998   abdominal  . TOTAL VAGINAL HYSTERECTOMY  2001    FAMHx:  Family History  Problem Relation Age of Onset  . Coronary artery disease Brother   . Cerebral aneurysm Sister   . Alzheimer's disease Unknown   . Hypertension Unknown   . Colon cancer Neg Hx   . Stomach cancer Neg Hx     SOCHx:   reports that she quit smoking about 15 years ago. She has never used smokeless tobacco. She reports that she drinks about 4.2 oz of alcohol per week . She reports that she does not use drugs.  ALLERGIES:  No Known Allergies  ROS: A comprehensive review of systems was negative.  HOME MEDS: Current Outpatient Prescriptions  Medication Sig Dispense Refill  .  atorvastatin (LIPITOR) 40 MG tablet Take 1 tablet (40 mg total) by mouth daily. 30 tablet 11  . cetirizine (ZYRTEC) 10 MG tablet Take 10 mg by mouth as needed for allergies (allergies).    Marland Kitchen estradiol (CLIMARA - DOSED IN MG/24 HR) 0.1 mg/24hr Place 1 patch onto the skin once a week.     . fluticasone (FLONASE) 50 MCG/ACT nasal spray Place 2 sprays into both nostrils daily.    . Fluticasone-Salmeterol (ADVAIR) 250-50 MCG/DOSE AEPB Inhale 1 puff into the lungs every 12 (twelve) hours.    . irbesartan (AVAPRO) 75 MG tablet Take 1 tablet (75 mg total) by mouth daily. 30 tablet 3  . PROAIR HFA 108 (90 Base) MCG/ACT inhaler Inhale two (2) puffs into the lungs every four (4) to six (6) hours as needed for shortness of breath or wheezing.  3   No current facility-administered medications for this visit.     LABS/IMAGING: No results found for this or any previous visit (from the past 48 hour(s)). No results found.  VITALS: BP (!) 140/94  Pulse 74   Ht 5' 7.25" (1.708 m)   Wt 143 lb (64.9 kg)   BMI 22.23 kg/m   EXAM: General appearance: alert and no distress Neck: no carotid bruit and no JVD Lungs: clear to auscultation bilaterally Heart: regular rate and rhythm, S1, S2 normal, no murmur, click, rub or gallop Abdomen: soft, non-tender; bowel sounds normal; no masses,  no organomegaly Extremities: edema Trace pedal and varicose veins noted Pulses: 2+ and symmetric Skin: Skin color, texture, turgor normal. No rashes or lesions Neurologic: GroPsych: Pleasanty normal Psych: Pleasant  EKG: Sinus rhythm with PACs at 74-percent review  ASSESSMENT: 1. Ectatic or borderline aneurysmal ascending aorta between 3.9 and 4.3 cm 2. Abnormal EKG 3. History of tobacco abuse 4. Hypertension 5. Family history of abdominal aortic aneurysm and cerebral aneurysm 6. Dyslipidemia 7. Symptomatic varicose veins  PLAN: 1.   Michelle Moreno seems to be doing well and has had a stable ectatic aorta. We'll  keep an eye on that. She has hypertension although is well-controlled. There is a family history of varicose veins and she seems to have that as well and she's been symptomatic with it. I've encouraged stocking wear but she says it's difficult for her to do. She saw vein specialist to offer treatment and she was not interested at that time but wanted to speak to me more about it. I think would be a good idea for her to have bilateral venous insufficiency studies as it's likely that she has more than superficial varicosities. She may have significant left greater saphenous vein reflux given her left thigh superficial phlebitis that may benefit from a venous ablation.  No changes to her medications today. Will repeat a lipid profile plan to see her back annually or sooner as necessary.  Pixie Casino, MD, [facility] Attending Cardiologist Climax C Hilty 02/12/2017, 9:52 AM

## 2017-02-14 ENCOUNTER — Other Ambulatory Visit: Payer: Self-pay | Admitting: Family Medicine

## 2017-02-14 ENCOUNTER — Encounter: Payer: Self-pay | Admitting: Internal Medicine

## 2017-02-14 DIAGNOSIS — E2839 Other primary ovarian failure: Secondary | ICD-10-CM

## 2017-02-20 ENCOUNTER — Encounter: Payer: Self-pay | Admitting: Gastroenterology

## 2017-02-23 ENCOUNTER — Other Ambulatory Visit: Payer: Self-pay | Admitting: Internal Medicine

## 2017-03-07 ENCOUNTER — Encounter (HOSPITAL_COMMUNITY): Payer: Medicare HMO

## 2017-03-12 DIAGNOSIS — J4520 Mild intermittent asthma, uncomplicated: Secondary | ICD-10-CM | POA: Diagnosis not present

## 2017-03-12 DIAGNOSIS — I10 Essential (primary) hypertension: Secondary | ICD-10-CM | POA: Diagnosis not present

## 2017-03-12 DIAGNOSIS — I712 Thoracic aortic aneurysm, without rupture: Secondary | ICD-10-CM | POA: Diagnosis not present

## 2017-03-12 DIAGNOSIS — Z6823 Body mass index (BMI) 23.0-23.9, adult: Secondary | ICD-10-CM | POA: Diagnosis not present

## 2017-03-12 DIAGNOSIS — Z23 Encounter for immunization: Secondary | ICD-10-CM | POA: Diagnosis not present

## 2017-03-16 ENCOUNTER — Ambulatory Visit (HOSPITAL_COMMUNITY)
Admission: RE | Admit: 2017-03-16 | Discharge: 2017-03-16 | Disposition: A | Discharge status: Home / Self Care | Payer: Medicare HMO | Source: Ambulatory Visit | Attending: Internal Medicine | Admitting: Internal Medicine

## 2017-03-16 DIAGNOSIS — I809 Phlebitis and thrombophlebitis of unspecified site: Secondary | ICD-10-CM | POA: Insufficient documentation

## 2017-03-16 DIAGNOSIS — I872 Venous insufficiency (chronic) (peripheral): Secondary | ICD-10-CM | POA: Insufficient documentation

## 2017-03-16 DIAGNOSIS — I83893 Varicose veins of bilateral lower extremities with other complications: Secondary | ICD-10-CM | POA: Diagnosis not present

## 2017-04-09 ENCOUNTER — Telehealth: Payer: Self-pay | Admitting: Internal Medicine

## 2017-04-09 DIAGNOSIS — I872 Venous insufficiency (chronic) (peripheral): Secondary | ICD-10-CM

## 2017-04-09 NOTE — Telephone Encounter (Signed)
I believe I already sent this too you - the ultrasound shows bilateral venous reflux. This was seen previously and she saw Dr. Franchot Gallo at [facility]. She may be a candidate for therapy - he advised compression stockings which I agree with. Recommend follow-up with him or if she wishes for another opinion, could refer to [facility] in [place].  Dr. Lemmie Evens

## 2017-04-09 NOTE — Telephone Encounter (Signed)
F/u message  Pt call to f/u please call back to discuss

## 2017-04-09 NOTE — Telephone Encounter (Signed)
Message routed to MD for test result info

## 2017-04-09 NOTE — Telephone Encounter (Signed)
Mrs. Haese is calling to get the results of her Ultra Sound of her Legs . Please call

## 2017-04-10 NOTE — Telephone Encounter (Signed)
Returned call to patient. She states she saw the doctor at [facility] last year and she has not received the most recent venous doppler results - apologized for delay. She would like another opinion so a referral has been ordered for [facility].

## 2017-04-11 DIAGNOSIS — R69 Illness, unspecified: Secondary | ICD-10-CM | POA: Diagnosis not present

## 2017-04-24 ENCOUNTER — Other Ambulatory Visit: Payer: Self-pay | Admitting: Internal Medicine

## 2017-04-27 ENCOUNTER — Encounter: Payer: Medicare HMO | Admitting: Vascular Surgery

## 2017-04-27 DIAGNOSIS — J069 Acute upper respiratory infection, unspecified: Secondary | ICD-10-CM | POA: Diagnosis not present

## 2017-04-27 DIAGNOSIS — Z6822 Body mass index (BMI) 22.0-22.9, adult: Secondary | ICD-10-CM | POA: Diagnosis not present

## 2017-05-18 ENCOUNTER — Other Ambulatory Visit: Payer: Self-pay | Admitting: Internal Medicine

## 2017-05-24 ENCOUNTER — Other Ambulatory Visit: Payer: Self-pay | Admitting: Internal Medicine

## 2017-05-24 NOTE — Telephone Encounter (Signed)
REFILL 

## 2017-06-06 ENCOUNTER — Encounter: Payer: Self-pay | Admitting: Vascular Surgery

## 2017-06-06 ENCOUNTER — Ambulatory Visit: Payer: Medicare HMO | Admitting: Vascular Surgery

## 2017-06-06 VITALS — BP 142/99 | HR 88 | Temp 98.5°F | Resp 18 | Ht 67.25 in | Wt 149.0 lb

## 2017-06-06 DIAGNOSIS — I872 Venous insufficiency (chronic) (peripheral): Secondary | ICD-10-CM | POA: Diagnosis not present

## 2017-06-06 DIAGNOSIS — M7989 Other specified soft tissue disorders: Secondary | ICD-10-CM

## 2017-06-06 NOTE — Progress Notes (Signed)
Patient ID: EMMERSEN GARRAWAY, female   DOB: 10-13-1950, 67 y.o.   MRN: [id number]  Reason for Consult: New Patient (Initial Visit) (eval venous reflux)   Referred by Alonna Buckler*  Subjective:     HPI:  FIDELA CIESLAK is a 67 y.o. female with a history of an ascending aortic aneurysm followed by cardiology as well as since about a varicose veins.  She has previously been seen in [place] for this problem was prescribed compression stockings but never wore them.  She has had progressive swelling as well as discomfort particularly in her left thigh from the varicosities and now wants to consider treatment.  She is undergone with reflux testing in our office a few months back.  She has never had superficial thrombophlebitis or any bleeding from her varicose veins.  She does work daily on her feet states that he becomes more uncomfortable at the end of the day.  She otherwise is healthy does not take any blood thinners.  Past Medical History:  Diagnosis Date  . Asthma   . HTN (hypertension)   . Hyperlipidemia   . Lung nodule   . PVC's (premature ventricular contractions)    Family History  Problem Relation Age of Onset  . Coronary artery disease Brother   . Heart disease Brother   . Cerebral aneurysm Sister   . Alzheimer's disease Unknown   . Hypertension Unknown   . AAA (abdominal aortic aneurysm) Mother   . Heart disease Father   . Colon cancer Neg Hx   . Stomach cancer Neg Hx    Past Surgical History:  Procedure Laterality Date  . HERNIA REPAIR  1998   abdominal  . TOTAL VAGINAL HYSTERECTOMY  2001    Short Social History:  Social History   Tobacco Use  . Smoking status: Former Smoker    Last attempt to quit: 05/08/2001    Years since quitting: 16.0  . Smokeless tobacco: Never Used  Substance Use Topics  . Alcohol use: Yes    Alcohol/week: 4.2 oz    Types: 7 Glasses of wine per week    No Known Allergies  Current Outpatient Medications  Medication Sig  Dispense Refill  . atorvastatin (LIPITOR) 40 MG tablet TAKE 1 TABLET BY MOUTH EVERY DAY 30 tablet 11  . cetirizine (ZYRTEC) 10 MG tablet Take 10 mg by mouth as needed for allergies (allergies).    Marland Kitchen estradiol (CLIMARA - DOSED IN MG/24 HR) 0.1 mg/24hr Place 1 patch onto the skin once a week.     . fluticasone (FLONASE) 50 MCG/ACT nasal spray Place 2 sprays into both nostrils daily.    . Fluticasone-Salmeterol (ADVAIR) 250-50 MCG/DOSE AEPB Inhale 1 puff into the lungs every 12 (twelve) hours.    . irbesartan (AVAPRO) 75 MG tablet TAKE 1 TABLET BY MOUTH EVERY DAY 30 tablet 11  . PROAIR HFA 108 (90 Base) MCG/ACT inhaler Inhale two (2) puffs into the lungs every four (4) to six (6) hours as needed for shortness of breath or wheezing.  3   No current facility-administered medications for this visit.     Review of Systems  Constitutional:  Constitutional negative. HENT: HENT negative.  Eyes: Eyes negative.  Respiratory: Respiratory negative.  Cardiovascular: Positive for leg swelling.  GI: Gastrointestinal negative.  Musculoskeletal: Positive for leg pain.  Skin: Skin negative.  Neurological: Neurological negative. Hematologic: Hematologic/lymphatic negative.  Psychiatric: Psychiatric negative.        Objective:  Objective   There were  no vitals filed for this visit. There is no height or weight on file to calculate BMI.  Physical Exam  Constitutional: She is oriented to person, place, and time. She appears well-developed.  Eyes: Pupils are equal, round, and reactive to light.  Neck: Normal range of motion.  Cardiovascular: Normal rate.  Pulses:      Radial pulses are 2+ on the right side, and 2+ on the left side.       Popliteal pulses are 2+ on the right side, and 2+ on the left side.  Pulmonary/Chest: Effort normal.  Abdominal: Soft. She exhibits no mass.  Musculoskeletal: Normal range of motion. She exhibits edema.  Neurological: She is alert and oriented to person, place,  and time.  Skin: Skin is warm and dry.  Cluster of varicosities on left medial thigh, small on posterior right thigh Spider veins left ankle  Psychiatric: She has a normal mood and affect. Her behavior is normal. Judgment and thought content normal.    Data: I reviewed her previous reflux studies which demonstrate  deep and superficial venous reflux bilaterally.  Her right greater saphenous vein measures 0.8 cm at the saphenofemoral junction as well as 0.53 cm at the knee and is reflux throughout the majority of the vein.  The left measures 0.8 cm at the junction as well as 0.5 cm at the knee with reflux throughout as well.     Assessment/Plan:     67 year old female without significant venous history with C3 venous disease as well as multiple varicosities causing discomfort.  She has been evaluated in the past was prescribed compression stockings but never ordered them.  We have given her the information today we will get her in compression stockings thigh-high and have her follow-up in 3 months for consideration of intervention.  She demonstrates good understanding and will be fitted for compression stockings.     Waynetta Sandy MD [facility]

## 2017-06-21 DIAGNOSIS — K649 Unspecified hemorrhoids: Secondary | ICD-10-CM | POA: Diagnosis not present

## 2017-06-21 DIAGNOSIS — Z1231 Encounter for screening mammogram for malignant neoplasm of breast: Secondary | ICD-10-CM | POA: Diagnosis not present

## 2017-06-21 DIAGNOSIS — Z79890 Hormone replacement therapy (postmenopausal): Secondary | ICD-10-CM | POA: Diagnosis not present

## 2017-06-21 DIAGNOSIS — Z124 Encounter for screening for malignant neoplasm of cervix: Secondary | ICD-10-CM | POA: Diagnosis not present

## 2017-07-09 DIAGNOSIS — K644 Residual hemorrhoidal skin tags: Secondary | ICD-10-CM | POA: Diagnosis not present

## 2017-09-04 ENCOUNTER — Encounter: Payer: Self-pay | Admitting: Vascular Surgery

## 2017-09-04 ENCOUNTER — Ambulatory Visit: Payer: Medicare HMO | Admitting: Vascular Surgery

## 2017-09-04 VITALS — BP 161/93 | HR 72 | Temp 97.1°F | Ht 67.0 in | Wt 150.0 lb

## 2017-09-04 DIAGNOSIS — M79605 Pain in left leg: Secondary | ICD-10-CM

## 2017-09-04 DIAGNOSIS — I83893 Varicose veins of bilateral lower extremities with other complications: Secondary | ICD-10-CM | POA: Diagnosis not present

## 2017-09-04 DIAGNOSIS — M79604 Pain in right leg: Secondary | ICD-10-CM | POA: Diagnosis not present

## 2017-09-04 DIAGNOSIS — M7989 Other specified soft tissue disorders: Secondary | ICD-10-CM | POA: Diagnosis not present

## 2017-09-04 DIAGNOSIS — I872 Venous insufficiency (chronic) (peripheral): Secondary | ICD-10-CM | POA: Diagnosis not present

## 2017-09-04 NOTE — Progress Notes (Addendum)
Established Venous Insufficiency   History of Present Illness   SKILYNN DURNEY is a 67 y.o. ([birth year]) female who presents with chief complaint of venous insufficiency with varicose veins.  Prior reflux study demonstrated insufficiency of bilateral greater saphenous veins as well as bilateral deep venous insufficiency.  Patient states her left leg bothers her more than her right with symptoms of edema, heaviness, fatigue, burning, and itching.  She also describes continued pain and discomfort of varicose veins of L more than R leg.  Her job requires her to be on her feet throughout most of the day and involves lifting and bending which has been hard to continue given her symptoms of bilateral lower extremities.  She denies history of DVT, venous ulcerations, or bleeding veins.  She has now been wearing thigh-high compression on bilateral lower extremity's for the last 3 months with minimal relief.  She has also made an effort to elevate her legs when possible during the day.  Patient has interest in proceeding with discussion on vein ablation and micro-stab phlebectomy.  She does not take any blood thinners.  The patient's PMH, PSH, SH, and FamHx were reviewed today and are unchanged from prior visit.  Current Outpatient Medications  Medication Sig Dispense Refill  . atorvastatin (LIPITOR) 40 MG tablet TAKE 1 TABLET BY MOUTH EVERY DAY 30 tablet 11  . cetirizine (ZYRTEC) 10 MG tablet Take 10 mg by mouth as needed for allergies (allergies).    Marland Kitchen estradiol (CLIMARA - DOSED IN MG/24 HR) 0.1 mg/24hr Place 1 patch onto the skin once a week.     . fluticasone (FLONASE) 50 MCG/ACT nasal spray Place 2 sprays into both nostrils daily.    . Fluticasone-Salmeterol (ADVAIR) 250-50 MCG/DOSE AEPB Inhale 1 puff into the lungs every 12 (twelve) hours.    . irbesartan (AVAPRO) 75 MG tablet TAKE 1 TABLET BY MOUTH EVERY DAY 30 tablet 11  . PROAIR HFA 108 (90 Base) MCG/ACT inhaler Inhale two (2) puffs into the  lungs every four (4) to six (6) hours as needed for shortness of breath or wheezing.  3   No current facility-administered medications for this visit.     No Known Allergies  On ROS today: 10 system ROS is negative unless otherwise noted in HPI   Physical Examination   Vitals:   09/04/17 1248 09/04/17 1250  BP: (!) 157/96 (!) 161/93  Pulse: 80 72  Temp: (!) 97.1 F (36.2 C)   SpO2: 99%   Weight: 150 lb (68 kg)   Height: 5\' 7"  (1.702 m)    Body mass index is 23.49 kg/m.  General Alert, O x 3, WD, NAD  Pulmonary Sym exp, good B air movt, CTA B  Cardiac RRR, Nl S1, S2,  Vascular Vessel Right Left  Radial Palpable Palpable  Brachial Palpable Palpable  Popliteal Not palpable Not palpable  PT Palpable Palpable  DP Faintly palpable Faintly palpable    Gastro- intestinal soft, non-distended,   Musculo- skeletal  palpable and prominent varicosities noted in left medial distal thigh and left calf; small cluster right posterior thigh; scattered spider veins bilateral lower extremities; no venous ulcerations or other skin changes  Neurologic A&O x3     Non-Invasive Vascular Imaging   BLE Venous Insufficiency Duplex    RLE:   Negative for DVT and SVT,    GSV reflux from mid thigh to lower leg, measures >0.5cm at level of the knee   Proximal SSV reflux,  CFV, femoral,  and popliteal deep venous reflux  LLE:  Negative for DVT and SVT,    GSV reflux throughout length, measures >0.5cm at level of the knee   Negative for SSV reflux,  CFV and popliteal deep venous reflux   Medical Decision Making   XELA OREGEL is a 67 y.o. female who presents with: BLE chronic venous insufficiency with painful varicose veins   Despite daily compression, elevation, and continued active lifestyle for a period of 3 months, the patient has found only minimal relief  She has been offered and would like to proceed with BLE greater saphenous vein ablation as well as LLE micro-stab  phlebectomy  We will await insurance approval  Dagoberto Ligas, PA-C [facility] [place] Office: [phone]  I have examined the patient, reviewed and agree with above.  Patient remains very active but is having complications of pain specifically over the large varicosities in her left medial thigh and also achy sensation related to bilateral venous hypertension related to great saphenous vein reflux.  I have recommended staged bilateral laser ablation of her great saphenous vein.  Would in all likelihood require stab phlebectomy of varicosities 3 months after ablation if she continues to have pain over these large varicosities.  She wishes to proceed as soon as possible  Curt Jews, MD 09/04/2017 2:08 PM

## 2017-09-11 ENCOUNTER — Other Ambulatory Visit: Payer: Self-pay | Admitting: *Deleted

## 2017-09-11 DIAGNOSIS — I83892 Varicose veins of left lower extremities with other complications: Secondary | ICD-10-CM

## 2017-09-11 DIAGNOSIS — I83891 Varicose veins of right lower extremities with other complications: Secondary | ICD-10-CM

## 2017-09-27 ENCOUNTER — Ambulatory Visit (INDEPENDENT_AMBULATORY_CARE_PROVIDER_SITE_OTHER): Payer: Medicare HMO | Admitting: Vascular Surgery

## 2017-09-27 ENCOUNTER — Encounter: Payer: Self-pay | Admitting: Vascular Surgery

## 2017-09-27 VITALS — BP 143/89 | HR 85 | Temp 97.4°F | Resp 16 | Ht 67.0 in | Wt 145.0 lb

## 2017-09-27 DIAGNOSIS — I83891 Varicose veins of right lower extremities with other complications: Secondary | ICD-10-CM | POA: Diagnosis not present

## 2017-09-27 NOTE — Progress Notes (Signed)
     Laser Ablation Procedure    Date: 09/27/2017   Michelle Moreno DOB:02/27/1951  Consent signed: Yes    Surgeon:  Dr. Sherren Mocha Xinyi Batton Argabright  Procedure: Laser Ablation: left Greater Saphenous Vein  BP (!) 143/89   Pulse 85   Temp (!) 97.4 F (36.3 C)   Resp 16   Ht 5\' 7"  (1.702 m)   Wt 145 lb (65.8 kg)   SpO2 98%   BMI 22.71 kg/m   Tumescent Anesthesia: 480 cc 0.9% NaCl with 50 cc Lidocaine HCL with 1% Epi and 15 cc 8.4% NaHCO3  Local Anesthesia: 3 cc Lidocaine HCL and NaHCO3 (ratio 2:1)  15 watts continuous mode        Total energy: 3159   Total time: 3:30    Patient tolerated procedure well  Notes:   Description of Procedure:  After marking the course of the secondary varicosities, the patient was placed on the operating table in the supine position, and the left leg was prepped and draped in sterile fashion.   Local anesthetic was administered and under ultrasound guidance the saphenous vein was accessed with a micro needle and guide wire; then the mirco puncture sheath was placed.  A guide wire was inserted saphenofemoral junction , followed by a 5 french sheath.  The position of the sheath and then the laser fiber below the junction was confirmed using the ultrasound.  Tumescent anesthesia was administered along the course of the saphenous vein using ultrasound guidance. The patient was placed in Trendelenburg position and protective laser glasses were placed on patient and staff, and the laser was fired at 15 watts continuous mode advancing 1-96mm/second for a total of 3159 joules.     Steri strips were applied to the stab wounds and ABD pads and thigh high compression stockings were applied.  Ace wrap bandages were applied over the phlebectomy sites and at the top of the saphenofemoral junction. Blood loss was less than 15 cc.  The patient ambulated out of the operating room having tolerated the procedure well.  Uneventful ablation from mid calf to just below the  saphenofemoral junction.  Follow-up in 1 week

## 2017-09-28 ENCOUNTER — Encounter: Payer: Self-pay | Admitting: Vascular Surgery

## 2017-10-04 ENCOUNTER — Encounter: Payer: Self-pay | Admitting: Vascular Surgery

## 2017-10-04 ENCOUNTER — Ambulatory Visit (INDEPENDENT_AMBULATORY_CARE_PROVIDER_SITE_OTHER): Payer: Medicare HMO | Admitting: Vascular Surgery

## 2017-10-04 ENCOUNTER — Ambulatory Visit (HOSPITAL_COMMUNITY)
Admission: RE | Admit: 2017-10-04 | Discharge: 2017-10-04 | Disposition: A | Discharge status: Home / Self Care | Payer: Medicare HMO | Source: Ambulatory Visit | Attending: Vascular Surgery | Admitting: Vascular Surgery

## 2017-10-04 VITALS — BP 146/93 | HR 60 | Temp 97.6°F | Resp 16 | Ht 67.0 in | Wt 145.0 lb

## 2017-10-04 DIAGNOSIS — I83892 Varicose veins of left lower extremities with other complications: Secondary | ICD-10-CM | POA: Diagnosis not present

## 2017-10-04 DIAGNOSIS — Z9889 Other specified postprocedural states: Secondary | ICD-10-CM | POA: Insufficient documentation

## 2017-10-04 DIAGNOSIS — I83891 Varicose veins of right lower extremities with other complications: Secondary | ICD-10-CM

## 2017-10-04 NOTE — Progress Notes (Signed)
[facility] [place]  Patient name: Michelle Moreno MRN: [id number] DOB: May 14, 1950 Sex: female  REASON FOR VISIT: One week follow-up for ablation left great saphenous vein  HPI: Michelle Moreno is a 67 y.o. female here for follow-up.  She has been compliant with her compression garment and is noted minimal discomfort associated with the ablation.  She does have a moderate amount of bruising.  Past Medical History:  Diagnosis Date  . Asthma   . HTN (hypertension)   . Hyperlipidemia   . Lung nodule   . PVC's (premature ventricular contractions)     Family History  Problem Relation Age of Onset  . Coronary artery disease Brother   . Heart disease Brother   . Cerebral aneurysm Sister   . Alzheimer's disease Unknown   . Hypertension Unknown   . AAA (abdominal aortic aneurysm) Mother   . Heart disease Father   . Colon cancer Neg Hx   . Stomach cancer Neg Hx     SOCIAL HISTORY: Social History   Tobacco Use  . Smoking status: Former Smoker    Last attempt to quit: 05/08/2001    Years since quitting: 16.4  . Smokeless tobacco: Never Used  Substance Use Topics  . Alcohol use: Yes    Alcohol/week: 4.2 oz    Types: 7 Glasses of wine per week    No Known Allergies  Current Outpatient Medications  Medication Sig Dispense Refill  . atorvastatin (LIPITOR) 40 MG tablet TAKE 1 TABLET BY MOUTH EVERY DAY 30 tablet 11  . cetirizine (ZYRTEC) 10 MG tablet Take 10 mg by mouth as needed for allergies (allergies).    Marland Kitchen estradiol (CLIMARA - DOSED IN MG/24 HR) 0.1 mg/24hr Place 1 patch onto the skin once a week.     . fluticasone (FLONASE) 50 MCG/ACT nasal spray Place 2 sprays into both nostrils daily.    . Fluticasone-Salmeterol (ADVAIR) 250-50 MCG/DOSE AEPB Inhale 1 puff into the lungs every 12 (twelve) hours.    . irbesartan (AVAPRO) 75 MG tablet TAKE 1 TABLET BY MOUTH EVERY DAY 30 tablet 11  . PROAIR HFA 108 (90 Base) MCG/ACT  inhaler Inhale two (2) puffs into the lungs every four (4) to six (6) hours as needed for shortness of breath or wheezing.  3   No current facility-administered medications for this visit.     REVIEW OF SYSTEMS:  [X]  denotes positive finding, [ ]  denotes negative finding Cardiac  Comments:  Chest pain or chest pressure:    Shortness of breath upon exertion:    Short of breath when lying flat:    Irregular heart rhythm:        Vascular    Pain in calf, thigh, or hip brought on by ambulation:    Pain in feet at night that wakes you up from your sleep:     Blood clot in your veins:    Leg swelling:           PHYSICAL EXAM: Vitals:   10/04/17 0943  BP: (!) 146/93  Pulse: 60  Resp: 16  Temp: 97.6 F (36.4 C)  SpO2: 100%  Weight: 145 lb (65.8 kg)  Height: 5\' 7"  (1.702 m)    GENERAL: The patient is a well-nourished female, in no acute distress. The vital signs are documented above. CARDIOVASCULAR: Palpable dorsalis pedis pulse.  Bruising from the ablation from her calf to her thigh.  She does have varicosities on her anterior thigh and  these appear to be partially thrombosed related to the treatment PULMONARY: There is good air exchange  MUSCULOSKELETAL: There are no major deformities or cyanosis. NEUROLOGIC: No focal weakness or paresthesias are detected. SKIN: There are no ulcers or rashes noted. PSYCHIATRIC: The patient has a normal affect.  DATA:  Duplex shows closure of her great saphenous vein from the proximal calf and also within her varicosity to 6 mm from the saphenofemoral junction and no DVT  MEDICAL ISSUES: Excellent Maahi Lannan result of laser ablation.  She will continue her compression for 1 additional week and then as needed.  Will see [place] again in several weeks where she will have similar treatment to her right leg    Rosetta Posner, MD [facility] Tel [phone] Pager [phone]

## 2017-10-18 ENCOUNTER — Telehealth: Payer: Self-pay | Admitting: Vascular Surgery

## 2017-10-18 ENCOUNTER — Encounter: Payer: Self-pay | Admitting: Vascular Surgery

## 2017-10-18 ENCOUNTER — Ambulatory Visit (INDEPENDENT_AMBULATORY_CARE_PROVIDER_SITE_OTHER): Payer: Medicare HMO | Admitting: Vascular Surgery

## 2017-10-18 VITALS — BP 124/88 | HR 96 | Temp 97.8°F | Resp 16 | Ht 67.0 in | Wt 145.0 lb

## 2017-10-18 DIAGNOSIS — I83891 Varicose veins of right lower extremities with other complications: Secondary | ICD-10-CM | POA: Diagnosis not present

## 2017-10-18 NOTE — Progress Notes (Signed)
     Laser Ablation Procedure    Date: 10/18/2017   Michelle Moreno DOB:1951/05/08  Consent signed: Yes    Surgeon:  Dr. Sherren Mocha Medina Degraffenreid  Procedure: Laser Ablation: right Greater Saphenous Vein  BP 124/88   Pulse 96   Temp 97.8 F (36.6 C)   Resp 16   Ht 5\' 7"  (1.702 m)   Wt 145 lb (65.8 kg)   SpO2 97%   BMI 22.71 kg/m   Tumescent Anesthesia: 400 cc 0.9% NaCl with 50 cc Lidocaine HCL with 1% Epi and 15 cc 8.4% NaHCO3  Local Anesthesia: 3 cc Lidocaine HCL and NaHCO3 (ratio 2:1)  15 watts continuous mode        Total energy: 2777   Total time: 3:04    Patient tolerated procedure well  Notes:   Description of Procedure:  After marking the course of the secondary varicosities, the patient was placed on the operating table in the supine position, and the right leg was prepped and draped in sterile fashion.   Local anesthetic was administered and under ultrasound guidance the saphenous vein was accessed with a micro needle and guide wire; then the mirco puncture sheath was placed.  A guide wire was inserted saphenofemoral junction , followed by a 5 french sheath.  The position of the sheath and then the laser fiber below the junction was confirmed using the ultrasound.  Tumescent anesthesia was administered along the course of the saphenous vein using ultrasound guidance. The patient was placed in Trendelenburg position and protective laser glasses were placed on patient and staff, and the laser was fired at 15 watts continuous mode advancing 1-36mm/second for a total of 2777 joules.     Steri strips were applied to the stab wounds and ABD pads and thigh high compression stockings were applied.  Ace wrap bandages were applied over the phlebectomy sites and at the top of the saphenofemoral junction. Blood loss was less than 15 cc.  The patient ambulated out of the operating room having tolerated the procedure well.  Uneventful ablation from mid calf to just below saphenofemoral junction   Follow-up in 1 week with duplex

## 2017-10-24 ENCOUNTER — Encounter: Payer: Self-pay | Admitting: Vascular Surgery

## 2017-10-25 ENCOUNTER — Ambulatory Visit (HOSPITAL_COMMUNITY)
Admission: RE | Admit: 2017-10-25 | Discharge: 2017-10-25 | Disposition: A | Discharge status: Home / Self Care | Payer: Medicare HMO | Source: Ambulatory Visit | Attending: Vascular Surgery | Admitting: Vascular Surgery

## 2017-10-25 ENCOUNTER — Ambulatory Visit: Payer: Medicare HMO | Admitting: Vascular Surgery

## 2017-10-25 ENCOUNTER — Encounter: Payer: Self-pay | Admitting: Vascular Surgery

## 2017-10-25 VITALS — BP 163/103 | HR 70 | Temp 97.6°F | Resp 16 | Ht 67.0 in | Wt 146.0 lb

## 2017-10-25 DIAGNOSIS — I83892 Varicose veins of left lower extremities with other complications: Secondary | ICD-10-CM | POA: Diagnosis not present

## 2017-10-25 DIAGNOSIS — I83891 Varicose veins of right lower extremities with other complications: Secondary | ICD-10-CM

## 2017-10-25 DIAGNOSIS — I82890 Acute embolism and thrombosis of other specified veins: Secondary | ICD-10-CM | POA: Insufficient documentation

## 2017-10-25 NOTE — Progress Notes (Signed)
[facility] [place]  Patient name: Michelle Moreno MRN: [id number] DOB: 08/24/1950 Sex: female  REASON FOR VISIT: One week follow-up of right great saphenous vein ablation  HPI: Michelle Moreno is a 67 y.o. female here today for follow-up of right great saphenous vein ablation 1 week ago.  Had similar treatment in her left leg several weeks ago.  She is done quite well.  She had mild discomfort associated with the ablation and typical bruising.  She has been compliant with her graduated compression garments.  Past Medical History:  Diagnosis Date  . Asthma   . HTN (hypertension)   . Hyperlipidemia   . Lung nodule   . PVC's (premature ventricular contractions)     Family History  Problem Relation Age of Onset  . Coronary artery disease Brother   . Heart disease Brother   . Cerebral aneurysm Sister   . Alzheimer's disease Unknown   . Hypertension Unknown   . AAA (abdominal aortic aneurysm) Mother   . Heart disease Father   . Colon cancer Neg Hx   . Stomach cancer Neg Hx     SOCIAL HISTORY: Social History   Tobacco Use  . Smoking status: Former Smoker    Last attempt to quit: 05/08/2001    Years since quitting: 16.4  . Smokeless tobacco: Never Used  Substance Use Topics  . Alcohol use: Yes    Alcohol/week: 4.2 oz    Types: 7 Glasses of wine per week    No Known Allergies  Current Outpatient Medications  Medication Sig Dispense Refill  . atorvastatin (LIPITOR) 40 MG tablet TAKE 1 TABLET BY MOUTH EVERY DAY 30 tablet 11  . cetirizine (ZYRTEC) 10 MG tablet Take 10 mg by mouth as needed for allergies (allergies).    Marland Kitchen estradiol (CLIMARA - DOSED IN MG/24 HR) 0.1 mg/24hr Place 1 patch onto the skin once a week.     . fluticasone (FLONASE) 50 MCG/ACT nasal spray Place 2 sprays into both nostrils daily.    . Fluticasone-Salmeterol (ADVAIR) 250-50 MCG/DOSE AEPB Inhale 1 puff into the lungs every 12 (twelve) hours.      . irbesartan (AVAPRO) 75 MG tablet TAKE 1 TABLET BY MOUTH EVERY DAY 30 tablet 11  . PROAIR HFA 108 (90 Base) MCG/ACT inhaler Inhale two (2) puffs into the lungs every four (4) to six (6) hours as needed for shortness of breath or wheezing.  3   No current facility-administered medications for this visit.     REVIEW OF SYSTEMS:  [X]  denotes positive finding, [ ]  denotes negative finding Cardiac  Comments:  Chest pain or chest pressure:    Shortness of breath upon exertion:    Short of breath when lying flat:    Irregular heart rhythm:        Vascular    Pain in calf, thigh, or hip brought on by ambulation:    Pain in feet at night that wakes you up from your sleep:     Blood clot in your veins:    Leg swelling:  x         PHYSICAL EXAM: Vitals:   10/25/17 0916  BP: (!) 163/103  Pulse: 70  Resp: 16  Temp: 97.6 F (36.4 C)  SpO2: 99%  Weight: 146 lb (66.2 kg)  Height: 5\' 7"  (1.702 m)    GENERAL: The patient is a well-nourished female, in no acute distress. The vital signs are documented above. CARDIOVASCULAR: Palpable dorsalis  pedis pulses bilaterally.  Thickening over her saphenous vein ablation sites and mild bruising PULMONARY: There is good air exchange  MUSCULOSKELETAL: There are no major deformities or cyanosis. NEUROLOGIC: No focal weakness or paresthesias are detected. SKIN: There are no ulcers or rashes noted. PSYCHIATRIC: The patient has a normal affect.  DATA:  Closure of her right great saphenous vein from the mid calf insertion site to 1/2 cm below the saphenofemoral junction and no DVT  MEDICAL ISSUES: Successful staged bilateral great saphenous vein ablation.  She had had marked varicosities bilaterally.  She has had better than the typical resolution of these with ablation alone.  She will continue to wear compression on an as-needed basis.  She will see Dr. Scot Dock in 3 months to determine if stab phlebectomy or other therapies are indicated.    Rosetta Posner, MD FACS [facility] Tel [phone] Pager [phone]

## 2017-12-20 ENCOUNTER — Telehealth: Payer: Self-pay | Admitting: Internal Medicine

## 2017-12-20 ENCOUNTER — Other Ambulatory Visit: Payer: Self-pay

## 2017-12-20 MED ORDER — LOSARTAN POTASSIUM 100 MG PO TABS: 100.0000 mg | ORAL_TABLET | Freq: Every day | ORAL | 2 refills | Status: DC

## 2017-12-20 NOTE — Telephone Encounter (Signed)
Called and notified patient of recommendations.

## 2017-12-20 NOTE — Telephone Encounter (Signed)
Called patient and advised of pharmacy recommendations.   Patient verbalized understanding. Gave pharmacy to submit too.

## 2017-12-20 NOTE — Telephone Encounter (Signed)
New Message    Pt c/o medication issue:  1. Name of Medication: irbesartan (AVAPRO) 75 MG tablet   2. How are you currently taking this medication (dosage and times per day)?   3. Are you having a reaction (difficulty breathing--STAT)?  4. What is your medication issue? Patient is calling requesting another medication because she is having difficulties obtaining  this medication. She is being advised that it is on back order. Please call to discuss.

## 2017-12-20 NOTE — Telephone Encounter (Signed)
Okay to replace with losartan 100mg  daily

## 2018-01-16 ENCOUNTER — Ambulatory Visit: Payer: Medicare HMO | Admitting: Vascular Surgery

## 2018-02-12 ENCOUNTER — Encounter: Payer: Self-pay | Admitting: Internal Medicine

## 2018-02-12 ENCOUNTER — Ambulatory Visit: Payer: Medicare HMO | Admitting: Internal Medicine

## 2018-02-12 VITALS — BP 128/90 | HR 82 | Ht 67.0 in | Wt 147.8 lb

## 2018-02-12 DIAGNOSIS — I712 Thoracic aortic aneurysm, without rupture, unspecified: Secondary | ICD-10-CM

## 2018-02-12 DIAGNOSIS — Z79899 Other long term (current) drug therapy: Secondary | ICD-10-CM | POA: Diagnosis not present

## 2018-02-12 DIAGNOSIS — I493 Ventricular premature depolarization: Secondary | ICD-10-CM | POA: Diagnosis not present

## 2018-02-12 DIAGNOSIS — I10 Essential (primary) hypertension: Secondary | ICD-10-CM | POA: Diagnosis not present

## 2018-02-12 DIAGNOSIS — E785 Hyperlipidemia, unspecified: Secondary | ICD-10-CM | POA: Diagnosis not present

## 2018-02-12 LAB — COMPREHENSIVE METABOLIC PANEL
ALT: 20 IU/L (ref 0–32)
AST: 25 IU/L (ref 0–40)
Albumin/Globulin Ratio: 2.3 — ABNORMAL HIGH (ref 1.2–2.2)
Albumin: 4.5 g/dL (ref 3.6–4.8)
Alkaline Phosphatase: 41 IU/L (ref 39–117)
BUN/Creatinine Ratio: 21 (ref 12–28)
BUN: 14 mg/dL (ref 8–27)
Bilirubin Total: 0.6 mg/dL (ref 0.0–1.2)
CO2: 24 mmol/L (ref 20–29)
Calcium: 9.4 mg/dL (ref 8.7–10.3)
Chloride: 97 mmol/L (ref 96–106)
Creatinine, Ser: 0.67 mg/dL (ref 0.57–1.00)
GFR calc Af Amer: 105 mL/min/{1.73_m2} (ref >59)
GFR calc non Af Amer: 91 mL/min/{1.73_m2} (ref >59)
Globulin, Total: 2 g/dL (ref 1.5–4.5)
Glucose: 121 mg/dL — ABNORMAL HIGH (ref 65–99)
Potassium: 4.2 mmol/L (ref 3.5–5.2)
Sodium: 136 mmol/L (ref 134–144)
Total Protein: 6.5 g/dL (ref 6.0–8.5)

## 2018-02-12 LAB — LIPID PANEL
Chol/HDL Ratio: 2.1 ratio (ref 0.0–4.4)
Cholesterol, Total: 169 mg/dL (ref 100–199)
HDL: 81 mg/dL (ref >39)
LDL Calculated: 70 mg/dL (ref 0–99)
Triglycerides: 89 mg/dL (ref 0–149)
VLDL Cholesterol Cal: 18 mg/dL (ref 5–40)

## 2018-02-12 MED ORDER — LOSARTAN POTASSIUM 100 MG PO TABS: 100.0000 mg | ORAL_TABLET | Freq: Every day | ORAL | 3 refills | Status: DC

## 2018-02-12 NOTE — Progress Notes (Signed)
OFFICE NOTE  Chief Complaint:  Routine follow-up  Primary Care Physician: Isaias Sakai, DO  HPI:  Michelle Moreno is a pleasant 67 year old female coming referred to me by Dr. Edwyna Ready. Her primary care provider is Dr. Lisbeth Ply. Her family history is very significant for heart disease. She tells me that her mother had a abdominal aortic aneurysm and is deceased. Her father heart disease. She also has a brother has heart disease at a young age and a sister who had a cerebral aneurysm and died. Personally she has no known coronary history. She does have a history of smoking in the past and risk factors including hypertension, age, dyslipidemia and is had PVCs. She denies any chest pain or worsening shortness of breath with exertion however is not particularly active. She is going to start doing yoga soon and wants to know if it's okay for her to start exercise. She also has concerns about her mother who had abdominal aortic aneurysm. Given her history of smoking and hypertension as well as family history she may be at risk for this. In addition, she is wondering whether she should have screening for cerebral aneurysms. Current guidelines recommend screening for cerebral aneurysms in patients who have 2 first-degree relatives with cerebral aneurysms. The incidence of cerebral aneurysm in patients with one first-degree relative is very low around 1%, therefore screening is not likely to be beneficial.  I saw Michelle Moreno back in the office today. 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 This was also noted on the radiology over read including a number of pulmonary nodules and hypodensities in the liver consistent with probably cysts. She also underwent abdominal ultrasound which was negative for any abdominal aortic aneurysm. While there was no coronary calcium  there is atherosclerosis of the abdominal aorta that was noted. Laboratory work was reviewed from her primary care provider including a total cholesterol 240 with LDL greater than 120.   Michelle Moreno returns today for follow-up. She's had some occasional episodes of discomfort in her chest at night particularly laying down. I did not suspect this is cardiac given her recent 0 coronary calcium score. She does have an ascending aortic aneurysm measuring 4.3 cm and is ordered for repeat CT of the aorta next February. Blood pressure today was very elevated in the office at 158/102. She reports significant whitecoat hypertension. She did take her valsartan and today. I rechecked her blood pressure at the end of the visit which should come down to 145/98. Still elevated.  02/23/2016  Michelle Moreno returns today for follow-up of her ascending aortic aneurysm. She underwent another CT scan of her aorta in January of this year. This demonstrated an ectatic/bordering on aneurysmal ascending thoracic aorta with a maximum diameter 3.9 cm. The aortic root was measured at 3.4 cm without effacement at the sinotubular junction. This contrasts an earlier CT finding of a dilated root to 4.3 cm. This is cause Michelle Moreno significant anxiety. I tried to explain the results to her today and the fact that there is some discrepancy on the CT findings may be related to operator air in measurement. The good news is that the descending aorta is now measuring smaller, but not normal. We will need additional data points to understand whether there has been any significant changes in the size of the vessel.  02/12/2017  Michelle Moreno was  seen today in follow-up. In February this year she had a repeat CT scan of her aorta which showed a stable ectatic aortic root measuring 3.9 cm. She had a number of small subcentimeter nodules which were stable compared to the CT scan finding in 2007 and are considered benign by the radiologist. He  denies any worsening shortness of breath. She gets some occasional chest discomfort in the left axillary area. This is not worse with exertion or relieved by rest and is more notable oftentimes at night while laying in bed. She is noted that she gets discomfort in her legs. Sometimes with walking or laying in bed at night. She gets achiness and restlessness. She also has some prominent varicose veins of the left anterior thigh and sounds like she may have had some phlebitis associated with this with burning and tingling as well as swelling. She saw vein specialist in [place] who felt that she could have a treatment for this but wanted her to discuss with me about that cardiac implications of ablating veins in her legs. We discussed this today and in fact I informed her that if she has varicose veins, they would not be considered to use as conduits for example for coronary bypass at any point in the future. She has tried stocking use but has been intolerant of them.  02/12/2018  Michelle Moreno is seen today in follow-up.  She has been working with vein and vascular surgery including Dr. early for saphenous vein ablation.  She has had significant relief from those procedures.  She denies any chest pain or worsening shortness of breath.  Her last CT scan in 2018 showed an ectatic aorta measuring 3.9 cm.  It was not necessarily recommended to follow this annually however will probably get another CT scan next year.  Other complaints include some soreness in her hands and stiffness.  Blood pressure is better controlled now after recently changing to losartan, however she has concerns about the recall.  PMHx:  Past Medical History:  Diagnosis Date  . Asthma   . HTN (hypertension)   . Hyperlipidemia   . Lung nodule   . PVC's (premature ventricular contractions)     Past Surgical History:  Procedure Laterality Date  . HERNIA REPAIR  1998   abdominal  . TOTAL VAGINAL HYSTERECTOMY  2001    FAMHx:    Family History  Problem Relation Age of Onset  . Coronary artery disease Brother   . Heart disease Brother   . Cerebral aneurysm Sister   . Alzheimer's disease Unknown   . Hypertension Unknown   . AAA (abdominal aortic aneurysm) Mother   . Heart disease Father   . Colon cancer Neg Hx   . Stomach cancer Neg Hx     SOCHx:   reports that she quit smoking about 16 years ago. She has never used smokeless tobacco. She reports that she drinks about 7.0 standard drinks of alcohol per week. She reports that she does not use drugs.  ALLERGIES:  No Known Allergies  ROS: A comprehensive review of systems was negative.  HOME MEDS: Current Outpatient Medications  Medication Sig Dispense Refill  . atorvastatin (LIPITOR) 40 MG tablet TAKE 1 TABLET BY MOUTH EVERY DAY 30 tablet 11  . cetirizine (ZYRTEC) 10 MG tablet Take 10 mg by mouth as needed for allergies (allergies).    Marland Kitchen estradiol (CLIMARA - DOSED IN MG/24 HR) 0.1 mg/24hr Place 1 patch onto the skin once a week.     Marland Kitchen  fluticasone (FLONASE) 50 MCG/ACT nasal spray Place 2 sprays into both nostrils daily.    Marland Kitchen losartan (COZAAR) 100 MG tablet Take 1 tablet (100 mg total) by mouth daily. 30 tablet 2  . Fluticasone-Salmeterol (ADVAIR) 250-50 MCG/DOSE AEPB Inhale 1 puff into the lungs every 12 (twelve) hours.    Marland Kitchen PROAIR HFA 108 (90 Base) MCG/ACT inhaler Inhale two (2) puffs into the lungs every four (4) to six (6) hours as needed for shortness of breath or wheezing.  3   No current facility-administered medications for this visit.     LABS/IMAGING: No results found for this or any previous visit (from the past 48 hour(s)). No results found.  VITALS: BP 128/90   Pulse 82   Ht 5\' 7"  (1.702 m)   Wt 147 lb 12.8 oz (67 kg)   BMI 23.15 kg/m   EXAM: General appearance: alert and no distress Neck: no carotid bruit and no JVD Lungs: clear to auscultation bilaterally Heart: regular rate and rhythm, S1, S2 normal, no murmur, click, rub or  gallop Abdomen: soft, non-tender; bowel sounds normal; no masses,  no organomegaly Extremities: edema Trace pedal and varicose veins noted Pulses: 2+ and symmetric Skin: Skin color, texture, turgor normal. No rashes or lesions Neurologic: GroPsych: Pleasanty normal Psych: Pleasant  EKG: Sinus rhythm at 82, possible left atrial enlargement, incomplete RBBB-personally reviewed  ASSESSMENT: 1. Ectatic or borderline aneurysmal ascending aorta between 3.9 and 4.3 cm 2. Abnormal EKG 3. History of tobacco abuse 4. Hypertension 5. Family history of abdominal aortic aneurysm and cerebral aneurysm 6. Dyslipidemia 7. 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   Michelle Moreno is doing much better after bilateral greater saphenous vein ablation.  Her leg pain symptoms have improved.  She will follow-up with me for an ectatic ascending aorta with a CT scan next year.  Blood pressure is well controlled.  She is requesting 90-day supply of her losartan which was sent to her pharmacy in [place].  Follow-up annually or sooner as necessary.  Pixie Casino, MD, [facility], [place] Director of the Advanced Lipid Disorders &  Cardiovascular [facility] Diplomate of the American Board of Clinical Lipidology Attending Cardiologist  Direct Dial: [phone]  Fax: [phone]  Website:  [email or web address] 02/12/2018, 9:37 AM

## 2018-02-12 NOTE — Patient Instructions (Signed)
Medication Instructions:  Dr Debara Pickett recommends that you continue on your current medications as directed. Please refer to the Current Medication list given to you today.  If you need a refill on your cardiac medications before your next appointment, please call your pharmacy.   Lab work: Your physician recommends that you return for lab work TODAY.  If you have labs (blood work) drawn today and your tests are completely normal, you will receive your results only by: Marland Kitchen MyChart Message (if you have MyChart) OR . A paper copy in the mail If you have any lab test that is abnormal or we need to change your treatment, we will call you to review the results.  Testing/Procedures: 1. CT Angiogram Aorta in 4 months (end of February) - Non-Cardiac CT Angiography (CTA), is a special type of CT scan that uses a computer to produce multi-dimensional views of major blood vessels throughout the body. In CT angiography, a contrast material is injected through an IV to help visualize the blood vessels.   >> This has been ordered to be completed at our [facility] location [address], [place] [address] [phone]  Follow-Up: At [facility], you and your health needs are our priority.  As part of our continuing mission to provide you with exceptional heart care, we have created designated Provider Care Teams.  These Care Teams include your primary Cardiologist (physician) and Advanced Practice Providers (APPs -  Physician Assistants and Nurse Practitioners) who all work together to provide you with the care you need, when you need it. You will need a follow up appointment in 12 months.  Please call our office 2 months in advance to schedule this appointment.  You may see Pixie Casino, MD or one of the following Advanced Practice Providers on your designated Care Team: [place], [place] . Fabian Sharp, PA-C

## 2018-02-14 NOTE — Addendum Note (Signed)
Addended by: Diana Eves on: 02/14/2018 03:52 PM   Modules accepted: Orders

## 2018-02-20 ENCOUNTER — Ambulatory Visit: Payer: Medicare HMO | Admitting: Vascular Surgery

## 2018-02-20 ENCOUNTER — Encounter: Payer: Self-pay | Admitting: Vascular Surgery

## 2018-02-20 VITALS — BP 132/89 | HR 74 | Temp 97.0°F | Resp 16 | Ht 67.0 in | Wt 148.6 lb

## 2018-02-20 DIAGNOSIS — I872 Venous insufficiency (chronic) (peripheral): Secondary | ICD-10-CM

## 2018-02-20 NOTE — Progress Notes (Signed)
Patient name: Michelle Michelle MRN: [id number] DOB: 02-17-1951 Sex: female  REASON FOR VISIT:   Follow-up of painful bilateral varicose veins.  HPI:   Michelle Michelle is a pleasant 67 y.o. female who was last seen by Dr. Sherren Mocha Early on 10/25/2017.  At that time she was 1 week status post endovenous laser ablation of the right great saphenous vein.  Her duplex at that time showed no evidence of DVT and successful closure of the vein to within a half a centimeter of the saphenofemoral junction.  She comes in today to reassess her varicose veins in both lower extremities.  Her symptoms in both legs have improved significantly since her staged bilateral great saphenous vein laser ablations.  She does continue to have some varicose veins of both lower extremities which are bothersome to her.  She continues to wear her compression stockings, elevate her legs, take ibuprofen as needed for pain.   Current Outpatient Medications  Medication Sig Dispense Refill  . atorvastatin (LIPITOR) 40 MG tablet TAKE 1 TABLET BY MOUTH EVERY DAY 30 tablet 11  . cetirizine (ZYRTEC) 10 MG tablet Take 10 mg by mouth as needed for allergies (allergies).    Marland Kitchen estradiol (CLIMARA - DOSED IN MG/24 HR) 0.1 mg/24hr Place 1 patch onto the skin once a week.     . fluticasone (FLONASE) 50 MCG/ACT nasal spray Place 2 sprays into both nostrils daily as needed.     . Fluticasone-Salmeterol (ADVAIR) 250-50 MCG/DOSE AEPB Inhale 1 puff into the lungs every 12 (twelve) hours.    Marland Kitchen losartan (COZAAR) 100 MG tablet Take 1 tablet (100 mg total) by mouth daily. 90 tablet 3  . PROAIR HFA 108 (90 Base) MCG/ACT inhaler Inhale two (2) puffs into the lungs every four (4) to six (6) hours as needed for shortness of breath or wheezing.  3   No current facility-administered medications for this visit.     REVIEW OF SYSTEMS:  [X]  denotes positive finding, [ ]  denotes negative finding Vascular    Leg swelling    Cardiac    Chest pain or chest  pressure:    Shortness of breath upon exertion:    Short of breath when lying flat:    Irregular heart rhythm:    Constitutional    Fever or chills:     PHYSICAL EXAM:   Vitals:   02/20/18 1357  BP: 132/89  Pulse: 74  Resp: 16  Temp: (!) 97 F (36.1 C)  TempSrc: Oral  SpO2: 93%  Weight: 148 lb 9.6 oz (67.4 kg)  Height: 5\' 7"  (1.702 m)    GENERAL: The patient is a well-nourished female, in no acute distress. The vital signs are documented above. CARDIOVASCULAR: There is a regular rate and rhythm. PULMONARY: There is good air exchange bilaterally without wheezing or rales. She has some reticular veins and spider veins bilaterally.  These are more significant on the posterior right calf and right thigh.  DATA:   No new data  MEDICAL ISSUES:   PAINFUL VARICOSE VEINS BILATERALLY: Patient is done well status post staged bilateral endovenous laser ablations of her great saphenous veins.  She had significant decompression of her varicose veins but still has some reticular veins and spider veins bilaterally which are bothersome.  I think she is a good candidate for sclerotherapy bilaterally and she would likely require 1 unit of sclerotherapy.  We will arrange to have this done in the near future.  I will see her back  as needed.  Deitra Mayo [facility] [phone]

## 2018-02-26 ENCOUNTER — Other Ambulatory Visit: Payer: Self-pay | Admitting: Internal Medicine

## 2018-03-05 ENCOUNTER — Ambulatory Visit: Payer: Medicare HMO | Admitting: *Deleted

## 2018-03-05 DIAGNOSIS — I83893 Varicose veins of bilateral lower extremities with other complications: Secondary | ICD-10-CM

## 2018-03-05 NOTE — Progress Notes (Signed)
X=.3% Sotradecol administered with a 27g butterfly.  Patient received a total of 7cc. To treat small spiders and .5% for a total of 12 cc to treat the reticulars.  Treated a combo of spiders and reticulars. Easy access. Tol well. Anticipate good results. Follow prn.   Photos: Yes.    Compression stockings applied: Yes.

## 2018-03-13 DIAGNOSIS — J4520 Mild intermittent asthma, uncomplicated: Secondary | ICD-10-CM | POA: Diagnosis not present

## 2018-03-13 DIAGNOSIS — Z23 Encounter for immunization: Secondary | ICD-10-CM | POA: Diagnosis not present

## 2018-03-13 DIAGNOSIS — I712 Thoracic aortic aneurysm, without rupture: Secondary | ICD-10-CM | POA: Diagnosis not present

## 2018-03-13 DIAGNOSIS — I10 Essential (primary) hypertension: Secondary | ICD-10-CM | POA: Diagnosis not present

## 2018-03-13 DIAGNOSIS — Z6823 Body mass index (BMI) 23.0-23.9, adult: Secondary | ICD-10-CM | POA: Diagnosis not present

## 2018-03-13 DIAGNOSIS — E7800 Pure hypercholesterolemia, unspecified: Secondary | ICD-10-CM | POA: Diagnosis not present

## 2018-03-13 DIAGNOSIS — Z9181 History of falling: Secondary | ICD-10-CM | POA: Diagnosis not present

## 2018-03-13 DIAGNOSIS — Z1211 Encounter for screening for malignant neoplasm of colon: Secondary | ICD-10-CM | POA: Diagnosis not present

## 2018-03-13 DIAGNOSIS — Z1331 Encounter for screening for depression: Secondary | ICD-10-CM | POA: Diagnosis not present

## 2018-04-11 DIAGNOSIS — Z23 Encounter for immunization: Secondary | ICD-10-CM | POA: Diagnosis not present

## 2018-05-19 ENCOUNTER — Other Ambulatory Visit: Payer: Self-pay | Admitting: Internal Medicine

## 2018-06-17 DIAGNOSIS — I712 Thoracic aortic aneurysm, without rupture: Secondary | ICD-10-CM | POA: Diagnosis not present

## 2018-06-17 DIAGNOSIS — Z79899 Other long term (current) drug therapy: Secondary | ICD-10-CM | POA: Diagnosis not present

## 2018-06-17 LAB — BASIC METABOLIC PANEL WITH GFR
BUN/Creatinine Ratio: 20 (ref 12–28)
BUN: 12 mg/dL (ref 8–27)
CO2: 23 mmol/L (ref 20–29)
Calcium: 9.3 mg/dL (ref 8.7–10.3)
Chloride: 102 mmol/L (ref 96–106)
Creatinine, Ser: 0.6 mg/dL (ref 0.57–1.00)
GFR calc Af Amer: 109 mL/min/1.73
GFR calc non Af Amer: 95 mL/min/1.73
Glucose: 102 mg/dL — ABNORMAL HIGH (ref 65–99)
Potassium: 4 mmol/L (ref 3.5–5.2)
Sodium: 139 mmol/L (ref 134–144)

## 2018-06-28 ENCOUNTER — Ambulatory Visit (INDEPENDENT_AMBULATORY_CARE_PROVIDER_SITE_OTHER)
Admission: RE | Admit: 2018-06-28 | Discharge: 2018-06-28 | Disposition: A | Discharge status: Home / Self Care | Payer: Medicare HMO | Source: Ambulatory Visit | Attending: Internal Medicine | Admitting: Internal Medicine

## 2018-06-28 ENCOUNTER — Inpatient Hospital Stay: Admission: RE | Admit: 2018-06-28 | Payer: Medicare HMO | Source: Ambulatory Visit

## 2018-06-28 DIAGNOSIS — I712 Thoracic aortic aneurysm, without rupture, unspecified: Secondary | ICD-10-CM

## 2018-06-28 MED ORDER — IOPAMIDOL (ISOVUE-370) INJECTION 76%
100.0000 mL | Freq: Once | INTRAVENOUS | Status: AC | PRN
  Administered 2018-06-28: 100 mL via INTRAVENOUS

## 2018-07-01 ENCOUNTER — Telehealth: Payer: Self-pay | Admitting: Internal Medicine

## 2018-07-01 ENCOUNTER — Other Ambulatory Visit: Payer: Self-pay

## 2018-07-01 DIAGNOSIS — I712 Thoracic aortic aneurysm, without rupture, unspecified: Secondary | ICD-10-CM

## 2018-07-01 DIAGNOSIS — Q991 46, XX true hermaphrodite: Secondary | ICD-10-CM

## 2018-07-01 NOTE — Telephone Encounter (Signed)
Pt advised Ct results and verbalized understanding.

## 2018-07-01 NOTE — Telephone Encounter (Signed)
Notes recorded by Pixie Casino, MD on 07/01/2018 at 10:43 AM EST Mild increase in aortic size from 3.9 to 4.1 cm (still considered mild, not worrisome) - repeat CT aortogram in 1 year.

## 2018-07-01 NOTE — Telephone Encounter (Signed)
Patient result called for results.

## 2018-07-23 ENCOUNTER — Encounter: Payer: Self-pay | Admitting: Gastroenterology

## 2018-07-24 ENCOUNTER — Encounter: Payer: Self-pay | Admitting: Gastroenterology

## 2018-08-10 ENCOUNTER — Other Ambulatory Visit: Payer: Self-pay | Admitting: Internal Medicine

## 2018-08-12 NOTE — Telephone Encounter (Signed)
Atorvastatin refilled.  

## 2018-10-22 DIAGNOSIS — Z1231 Encounter for screening mammogram for malignant neoplasm of breast: Secondary | ICD-10-CM | POA: Diagnosis not present

## 2018-10-22 DIAGNOSIS — Z01419 Encounter for gynecological examination (general) (routine) without abnormal findings: Secondary | ICD-10-CM | POA: Diagnosis not present

## 2018-11-05 ENCOUNTER — Telehealth: Payer: Self-pay | Admitting: Internal Medicine

## 2018-11-05 MED ORDER — ROSUVASTATIN CALCIUM 20 MG PO TABS: 20.0000 mg | ORAL_TABLET | ORAL | 3 refills | Status: DC

## 2018-11-05 NOTE — Telephone Encounter (Signed)
Returned cal to pt she will stop Atorva. She will await Dr Lysbeth Penner further instruction. Pt had CT scan in February but she states that she has not had any instruction on her dilated Aorta. Please advise

## 2018-11-05 NOTE — Telephone Encounter (Signed)
repeat CT aortogram in 1 year-due 2-21

## 2018-11-05 NOTE — Telephone Encounter (Signed)
Per chart review, triage nurse notified patient of CT results on 07/01/2018.   Will wait on MD for statin instructions

## 2018-11-05 NOTE — Telephone Encounter (Signed)
Pt c/o medication issue:  1. Name of Medication: atorvastatin (LIPITOR) 40 MG tablet  2. How are you currently taking this medication (dosage and times per day)?   3. Are you having a reaction (difficulty breathing--STAT)?   4. What is your medication issue? Patient was having joint pain, she stop taking the medication for the past 7 days and she states that her symptoms improved.  She wants to know if she should continue to be off the medication. Should she have some blood work done.

## 2018-11-05 NOTE — Telephone Encounter (Signed)
Patient called w/MD recommendations. She agrees to try crestor QOD. Rx(s) sent to pharmacy electronically. She is due for 1 year visit in Oct - she is aware she will get a a reminder about this.  Lipid panel will be mailed to patient in Sept

## 2018-11-05 NOTE — Telephone Encounter (Signed)
Could try switch to crestor 20 mg QOD if she is willing to try it. Repeat lipids in 3 months.  Dr. Lemmie Evens

## 2019-01-06 ENCOUNTER — Other Ambulatory Visit: Payer: Self-pay | Admitting: Internal Medicine

## 2019-01-06 DIAGNOSIS — E785 Hyperlipidemia, unspecified: Secondary | ICD-10-CM

## 2019-02-03 ENCOUNTER — Other Ambulatory Visit: Payer: Self-pay | Admitting: Internal Medicine

## 2019-02-11 DIAGNOSIS — E785 Hyperlipidemia, unspecified: Secondary | ICD-10-CM | POA: Diagnosis not present

## 2019-02-11 LAB — LIPID PANEL
Chol/HDL Ratio: 1.9 ratio (ref 0.0–4.4)
Cholesterol, Total: 158 mg/dL (ref 100–199)
HDL: 85 mg/dL (ref >39)
LDL Chol Calc (NIH): 59 mg/dL (ref 0–99)
Triglycerides: 76 mg/dL (ref 0–149)
VLDL Cholesterol Cal: 14 mg/dL (ref 5–40)

## 2019-02-17 ENCOUNTER — Encounter: Payer: Self-pay | Admitting: Internal Medicine

## 2019-02-17 ENCOUNTER — Other Ambulatory Visit: Payer: Self-pay

## 2019-02-17 ENCOUNTER — Ambulatory Visit: Payer: Medicare HMO | Admitting: Internal Medicine

## 2019-02-17 VITALS — BP 141/94 | HR 83 | Ht 67.0 in | Wt 145.2 lb

## 2019-02-17 DIAGNOSIS — I10 Essential (primary) hypertension: Secondary | ICD-10-CM | POA: Diagnosis not present

## 2019-02-17 DIAGNOSIS — I493 Ventricular premature depolarization: Secondary | ICD-10-CM

## 2019-02-17 DIAGNOSIS — I712 Thoracic aortic aneurysm, without rupture, unspecified: Secondary | ICD-10-CM

## 2019-02-17 DIAGNOSIS — I872 Venous insufficiency (chronic) (peripheral): Secondary | ICD-10-CM

## 2019-02-17 MED ORDER — ROSUVASTATIN CALCIUM 5 MG PO TABS: 5.0000 mg | ORAL_TABLET | ORAL | 3 refills | Status: DC

## 2019-02-17 MED ORDER — ROSUVASTATIN CALCIUM 5 MG PO TABS: 20.0000 mg | ORAL_TABLET | ORAL | 3 refills | Status: DC

## 2019-02-17 NOTE — Progress Notes (Signed)
OFFICE NOTE  Chief Complaint:  Routine follow-up  Primary Care Physician: Isaias Sakai, DO  HPI:  Michelle Moreno is a pleasant 68 year old female coming referred to me by Dr. Edwyna Ready. Her primary care provider is Dr. Lisbeth Ply. Her family history is very significant for heart disease. She tells me that her mother had a abdominal aortic aneurysm and is deceased. Her father heart disease. She also has a brother has heart disease at a young age and a sister who had a cerebral aneurysm and died. Personally she has no known coronary history. She does have a history of smoking in the past and risk factors including hypertension, age, dyslipidemia and is had PVCs. She denies any chest pain or worsening shortness of breath with exertion however is not particularly active. She is going to start doing yoga soon and wants to know if it's okay for her to start exercise. She also has concerns about her mother who had abdominal aortic aneurysm. Given her history of smoking and hypertension as well as family history she may be at risk for this. In addition, she is wondering whether she should have screening for cerebral aneurysms. Current guidelines recommend screening for cerebral aneurysms in patients who have 2 first-degree relatives with cerebral aneurysms. The incidence of cerebral aneurysm in patients with one first-degree relative is very low around 1%, therefore screening is not likely to be beneficial.  I saw Michelle Moreno back in the office today. 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 This was also noted on the radiology over read including a number of pulmonary nodules and hypodensities in the liver consistent with probably cysts. She also underwent abdominal ultrasound which was negative for any abdominal aortic aneurysm. While there was no coronary calcium  there is atherosclerosis of the abdominal aorta that was noted. Laboratory work was reviewed from her primary care provider including a total cholesterol 240 with LDL greater than 120.   Michelle Moreno returns today for follow-up. She's had some occasional episodes of discomfort in her chest at night particularly laying down. I did not suspect this is cardiac given her recent 0 coronary calcium score. She does have an ascending aortic aneurysm measuring 4.3 cm and is ordered for repeat CT of the aorta next February. Blood pressure today was very elevated in the office at 158/102. She reports significant whitecoat hypertension. She did take her valsartan and today. I rechecked her blood pressure at the end of the visit which should come down to 145/98. Still elevated.  02/23/2016  Michelle Moreno returns today for follow-up of her ascending aortic aneurysm. She underwent another CT scan of her aorta in January of this year. This demonstrated an ectatic/bordering on aneurysmal ascending thoracic aorta with a maximum diameter 3.9 cm. The aortic root was measured at 3.4 cm without effacement at the sinotubular junction. This contrasts an earlier CT finding of a dilated root to 4.3 cm. This is cause Michelle Moreno significant anxiety. I tried to explain the results to her today and the fact that there is some discrepancy on the CT findings may be related to operator air in measurement. The good news is that the descending aorta is now measuring smaller, but not normal. We will need additional data points to understand whether there has been any significant changes in the size of the vessel.  02/12/2017  Michelle Moreno was  seen today in follow-up. In February this year she had a repeat CT scan of her aorta which showed a stable ectatic aortic root measuring 3.9 cm. She had a number of small subcentimeter nodules which were stable compared to the CT scan finding in 2007 and are considered benign by the radiologist. He  denies any worsening shortness of breath. She gets some occasional chest discomfort in the left axillary area. This is not worse with exertion or relieved by rest and is more notable oftentimes at night while laying in bed. She is noted that she gets discomfort in her legs. Sometimes with walking or laying in bed at night. She gets achiness and restlessness. She also has some prominent varicose veins of the left anterior thigh and sounds like she may have had some phlebitis associated with this with burning and tingling as well as swelling. She saw vein specialist in [place] who felt that she could have a treatment for this but wanted her to discuss with me about that cardiac implications of ablating veins in her legs. We discussed this today and in fact I informed her that if she has varicose veins, they would not be considered to use as conduits for example for coronary bypass at any point in the future. She has tried stocking use but has been intolerant of them.  02/12/2018  Michelle Moreno is seen today in follow-up.  She has been working with vein and vascular surgery including Dr. early for saphenous vein ablation.  She has had significant relief from those procedures.  She denies any chest pain or worsening shortness of breath.  Her last CT scan in 2018 showed an ectatic aorta measuring 3.9 cm.  It was not necessarily recommended to follow this annually however will probably get another CT scan next year.  Other complaints include some soreness in her hands and stiffness.  Blood pressure is better controlled now after recently changing to losartan, however she has concerns about the recall.  02/17/2019  Michelle Moreno returns today for follow-up.  She denies any chest pain or worsening shortness of breath.  Her recent lipid profile showed total cholesterol 158, triglycerides 76, HDL 85 and LDL 59.  She denies any symptomatic PVCs however they are noted on her EKG today.  She has had variable measurements  of her CT angiogram regarding an ectatic aorta.  It now measures 4.1 cm as of February 2020 however had measured 3.9 cm and prior to that was 4.3 cm.  She will have repeat testing in 1 year.  PMHx:  Past Medical History:  Diagnosis Date  . Asthma   . HTN (hypertension)   . Hyperlipidemia   . Lung nodule   . PVC's (premature ventricular contractions)     Past Surgical History:  Procedure Laterality Date  . HERNIA REPAIR  1998   abdominal  . TOTAL VAGINAL HYSTERECTOMY  2001    FAMHx:  Family History  Problem Relation Age of Onset  . Coronary artery disease Brother   . Heart disease Brother   . Cerebral aneurysm Sister   . Alzheimer's disease Unknown   . Hypertension Unknown   . AAA (abdominal aortic aneurysm) Mother   . Heart disease Father   . Colon cancer Neg Hx   . Stomach cancer Neg Hx     SOCHx:   reports that she quit smoking about 17 years ago. She has never used smokeless tobacco. She reports current alcohol use of about 7.0 standard drinks of alcohol per  week. She reports that she does not use drugs.  ALLERGIES:  No Known Allergies  ROS: A comprehensive review of systems was negative.  HOME MEDS: Current Outpatient Medications  Medication Sig Dispense Refill  . cetirizine (ZYRTEC) 10 MG tablet Take 10 mg by mouth as needed for allergies (allergies).    Marland Kitchen estradiol (CLIMARA - DOSED IN MG/24 HR) 0.1 mg/24hr Place 1 patch onto the skin once a week.     . Fluticasone-Salmeterol (ADVAIR) 250-50 MCG/DOSE AEPB Inhale 1 puff into the lungs every 12 (twelve) hours.    Marland Kitchen losartan (COZAAR) 100 MG tablet TAKE 1 TABLET BY MOUTH EVERY DAY 90 tablet 2  . PROAIR HFA 108 (90 Base) MCG/ACT inhaler Inhale two (2) puffs into the lungs every four (4) to six (6) hours as needed for shortness of breath or wheezing.  3  . rosuvastatin (CRESTOR) 20 MG tablet Take 1 tablet (20 mg total) by mouth every other day. 45 tablet 3   No current facility-administered medications for this  visit.     LABS/IMAGING: No results found for this or any previous visit (from the past 48 hour(s)). No results found.  VITALS: BP (!) 141/94   Pulse 83   Ht 5\' 7"  (1.702 m)   Wt 145 lb 3.2 oz (65.9 kg)   SpO2 98%   BMI 22.74 kg/m   EXAM: General appearance: alert and no distress Neck: no carotid bruit and no JVD Lungs: clear to auscultation bilaterally Heart: regular rate and rhythm, S1, S2 normal, no murmur, click, rub or gallop Abdomen: soft, non-tender; bowel sounds normal; no masses,  no organomegaly Extremities: edema Trace pedal and varicose veins noted Pulses: 2+ and symmetric Skin: Skin color, texture, turgor normal. No rashes or lesions Neurologic: GroPsych: Pleasanty normal Psych: Pleasant  EKG: Sinus rhythm with PVCs at 83, incomplete right bundle branch block, left atrial enlargement-personally reviewed  ASSESSMENT: 1. Ectatic or borderline aneurysmal ascending aorta between 3.9 and 4.3 cm 2. Abnormal EKG 3. History of tobacco abuse 4. Hypertension 5. Family history of abdominal aortic aneurysm and cerebral aneurysm 6. Dyslipidemia 7. 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 CAC score of 0 (06/2014)  PLAN: 1.   Michelle Moreno seems to be doing well without any worsening chest pain or worsening shortness of breath.  I believe her aortic aneurysm is stable although increased from the measurement last year it is less than it had been measured before.  Cholesterol is at goal.  She feels that she may still have some side effects on Crestor.  As her recent coronary calcium score was 0, her target LDL should be less than 100.  Based on that I would recommend decreasing her Crestor to 5 mg every other day to see if this improves her side effects.  Her blood pressure is a little elevated in the office today, but she reports it is improved at home..  Follow-up annually or sooner as necessary.  Pixie Casino, MD, [facility], [place] Director of the Advanced Lipid Disorders &  Cardiovascular Risk Reduction Clinic Diplomate of the American Board of Clinical Lipidology Attending Cardiologist  Direct Dial: [phone]  Fax: [phone]  Website:  [email or web address] 02/17/2019, 3:04 PM

## 2019-02-17 NOTE — Patient Instructions (Signed)
Medication Instructions:  Dr Debara Pickett has recommended making the following medication changes: 1. DECREASE Rosuvastatin (Crestor) to 5 mg every other day  If you need a refill on your cardiac medications before your next appointment, please call your pharmacy.   Follow-Up: At [facility], you and your health needs are our priority.  As part of our continuing mission to provide you with exceptional heart care, we have created designated Provider Care Teams.  These Care Teams include your primary Cardiologist (physician) and Advanced Practice Providers (APPs -  Physician Assistants and Nurse Practitioners) who all work together to provide you with the care you need, when you need it. You will need a follow up appointment in 12 months.  Please call our office 2 months in advance to schedule this appointment.  You may see Pixie Casino, MD or one of the following Advanced Practice Providers on your designated Care Team: [place], [place] . Fabian Sharp, PA-C . You will receive a reminder letter in the mail two months in advance. If you don't receive a letter, please call our office to schedule the follow-up appointment.

## 2019-02-18 ENCOUNTER — Encounter: Payer: Self-pay | Admitting: Internal Medicine

## 2019-02-27 DIAGNOSIS — R69 Illness, unspecified: Secondary | ICD-10-CM | POA: Diagnosis not present

## 2019-03-31 DIAGNOSIS — J4520 Mild intermittent asthma, uncomplicated: Secondary | ICD-10-CM | POA: Diagnosis not present

## 2019-03-31 DIAGNOSIS — R300 Dysuria: Secondary | ICD-10-CM | POA: Diagnosis not present

## 2019-03-31 DIAGNOSIS — Z6823 Body mass index (BMI) 23.0-23.9, adult: Secondary | ICD-10-CM | POA: Diagnosis not present

## 2019-04-14 DIAGNOSIS — Z1331 Encounter for screening for depression: Secondary | ICD-10-CM | POA: Diagnosis not present

## 2019-04-14 DIAGNOSIS — Z9181 History of falling: Secondary | ICD-10-CM | POA: Diagnosis not present

## 2019-04-14 DIAGNOSIS — Z139 Encounter for screening, unspecified: Secondary | ICD-10-CM | POA: Diagnosis not present

## 2019-04-14 DIAGNOSIS — Z0000 Encounter for general adult medical examination without abnormal findings: Secondary | ICD-10-CM | POA: Diagnosis not present

## 2019-04-14 DIAGNOSIS — E785 Hyperlipidemia, unspecified: Secondary | ICD-10-CM | POA: Diagnosis not present

## 2019-06-02 ENCOUNTER — Other Ambulatory Visit: Payer: Self-pay | Admitting: Internal Medicine

## 2019-06-02 ENCOUNTER — Telehealth: Payer: Self-pay | Admitting: Internal Medicine

## 2019-06-02 DIAGNOSIS — Z01812 Encounter for preprocedural laboratory examination: Secondary | ICD-10-CM

## 2019-06-02 NOTE — Telephone Encounter (Signed)
Spoke with patient regarding appointment for CTA chest aorta scheduled Monday 06/30/19 at 10:00 am [place] [address]---9:40 am arrival time---liquids only 2 hours prior---Patient to come in 06/23/19 or 06/24/19 for BMET

## 2019-06-02 NOTE — Telephone Encounter (Signed)
Left message for patient to call and schedule CTA chest aorta ordered by Dr. Debara Pickett

## 2019-06-24 ENCOUNTER — Other Ambulatory Visit: Payer: Self-pay

## 2019-06-24 DIAGNOSIS — Z01812 Encounter for preprocedural laboratory examination: Secondary | ICD-10-CM

## 2019-06-24 LAB — BASIC METABOLIC PANEL
BUN/Creatinine Ratio: 16 (ref 12–28)
BUN: 13 mg/dL (ref 8–27)
CO2: 23 mmol/L (ref 20–29)
Calcium: 9.3 mg/dL (ref 8.7–10.3)
Chloride: 99 mmol/L (ref 96–106)
Creatinine, Ser: 0.79 mg/dL (ref 0.57–1.00)
GFR calc Af Amer: 89 mL/min/{1.73_m2} (ref >59)
GFR calc non Af Amer: 77 mL/min/{1.73_m2} (ref >59)
Glucose: 109 mg/dL — ABNORMAL HIGH (ref 65–99)
Potassium: 4.4 mmol/L (ref 3.5–5.2)
Sodium: 136 mmol/L (ref 134–144)

## 2019-06-30 ENCOUNTER — Other Ambulatory Visit: Payer: Self-pay

## 2019-06-30 ENCOUNTER — Ambulatory Visit
Admission: RE | Admit: 2019-06-30 | Discharge: 2019-06-30 | Disposition: A | Discharge status: Home / Self Care | Payer: Medicare HMO | Source: Ambulatory Visit | Attending: Internal Medicine | Admitting: Internal Medicine

## 2019-06-30 DIAGNOSIS — Q991 46, XX true hermaphrodite: Secondary | ICD-10-CM

## 2019-06-30 DIAGNOSIS — I712 Thoracic aortic aneurysm, without rupture, unspecified: Secondary | ICD-10-CM

## 2019-06-30 MED ORDER — IOPAMIDOL (ISOVUE-370) INJECTION 76%
75.0000 mL | Freq: Once | INTRAVENOUS | Status: AC | PRN
  Administered 2019-06-30: 75 mL via INTRAVENOUS

## 2019-11-04 ENCOUNTER — Other Ambulatory Visit: Payer: Self-pay | Admitting: Internal Medicine

## 2019-11-25 ENCOUNTER — Telehealth: Payer: Self-pay | Admitting: Internal Medicine

## 2019-11-25 NOTE — Telephone Encounter (Signed)
Called patient- she would like to know any suggestions with her health conditions- I did advise that they are suggesting that patients get the vaccine, but she would like to know which one Dr.Hilty suggests. Advised I would route message to MD/RN  Patient thankful for call back

## 2019-11-25 NOTE — Telephone Encounter (Signed)
Patient is calling to get some guidance on whether or not the covid vaccine is safe for her due to the aneurysm at her heart and other health issues, and if it is safe for her to take, which one should she get. Please call.

## 2019-11-26 NOTE — Telephone Encounter (Signed)
Called patient, gave response from MD.  Patient verbalized understanding.   

## 2019-11-26 NOTE — Telephone Encounter (Signed)
Recommend COVID19 vaccine for pretty much everyone - would get the 2 vaccine series Therapist, music or [place]).  Dr Lemmie Evens

## 2020-01-28 ENCOUNTER — Other Ambulatory Visit: Payer: Self-pay | Admitting: Internal Medicine

## 2020-02-17 ENCOUNTER — Ambulatory Visit: Payer: Medicare HMO | Admitting: Internal Medicine

## 2020-03-17 ENCOUNTER — Encounter: Payer: Self-pay | Admitting: Internal Medicine

## 2020-03-17 ENCOUNTER — Other Ambulatory Visit: Payer: Self-pay

## 2020-03-17 ENCOUNTER — Ambulatory Visit (INDEPENDENT_AMBULATORY_CARE_PROVIDER_SITE_OTHER): Payer: Medicare Other | Admitting: Internal Medicine

## 2020-03-17 VITALS — BP 128/94 | HR 92 | Ht 67.0 in | Wt 142.6 lb

## 2020-03-17 DIAGNOSIS — E785 Hyperlipidemia, unspecified: Secondary | ICD-10-CM | POA: Diagnosis not present

## 2020-03-17 DIAGNOSIS — I712 Thoracic aortic aneurysm, without rupture, unspecified: Secondary | ICD-10-CM

## 2020-03-17 DIAGNOSIS — I493 Ventricular premature depolarization: Secondary | ICD-10-CM | POA: Diagnosis not present

## 2020-03-17 DIAGNOSIS — I10 Essential (primary) hypertension: Secondary | ICD-10-CM

## 2020-03-17 LAB — LIPID PANEL
Chol/HDL Ratio: 2.6 ratio (ref 0.0–4.4)
Cholesterol, Total: 241 mg/dL — ABNORMAL HIGH (ref 100–199)
HDL: 91 mg/dL (ref >39)
LDL Chol Calc (NIH): 132 mg/dL — ABNORMAL HIGH (ref 0–99)
Triglycerides: 104 mg/dL (ref 0–149)
VLDL Cholesterol Cal: 18 mg/dL (ref 5–40)

## 2020-03-17 NOTE — Progress Notes (Signed)
OFFICE NOTE  Chief Complaint:  Routine follow-up  Primary Care Physician: Isaias Sakai, DO  HPI:  Michelle Moreno is a pleasant 69 year old female coming referred to me by Dr. Edwyna Ready. Her primary care provider is Dr. Lisbeth Ply. Her family history is very significant for heart disease. She tells me that her mother had a abdominal aortic aneurysm and is deceased. Her father heart disease. She also has a brother has heart disease at a young age and a sister who had a cerebral aneurysm and died. Personally she has no known coronary history. She does have a history of smoking in the past and risk factors including hypertension, age, dyslipidemia and is had PVCs. She denies any chest pain or worsening shortness of breath with exertion however is not particularly active. She is going to start doing yoga soon and wants to know if it's okay for her to start exercise. She also has concerns about her mother who had abdominal aortic aneurysm. Given her history of smoking and hypertension as well as family history she may be at risk for this. In addition, she is wondering whether she should have screening for cerebral aneurysms. Current guidelines recommend screening for cerebral aneurysms in patients who have 2 first-degree relatives with cerebral aneurysms. The incidence of cerebral aneurysm in patients with one first-degree relative is very low around 1%, therefore screening is not likely to be beneficial.  I saw Michelle Moreno back in the office today. 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 This was also noted on the radiology over read including a number of pulmonary nodules and hypodensities in the liver consistent with probably cysts. She also underwent abdominal ultrasound which was negative for any abdominal aortic aneurysm. While there was no coronary calcium  there is atherosclerosis of the abdominal aorta that was noted. Laboratory work was reviewed from her primary care provider including a total cholesterol 240 with LDL greater than 120.   [name] returns today for follow-up. She's had some occasional episodes of discomfort in her chest at night particularly laying down. I did not suspect this is cardiac given her recent 0 coronary calcium score. She does have an ascending aortic aneurysm measuring 4.3 cm and is ordered for repeat CT of the aorta next February. Blood pressure today was very elevated in the office at 158/102. She reports significant whitecoat hypertension. She did take her valsartan and today. I rechecked her blood pressure at the end of the visit which should come down to 145/98. Still elevated.  02/23/2016  Michelle Moreno returns today for follow-up of her ascending aortic aneurysm. She underwent another CT scan of her aorta in January of this year. This demonstrated an ectatic/bordering on aneurysmal ascending thoracic aorta with a maximum diameter 3.9 cm. The aortic root was measured at 3.4 cm without effacement at the sinotubular junction. This contrasts an earlier CT finding of a dilated root to 4.3 cm. This is cause Ms. Madia significant anxiety. I tried to explain the results to her today and the fact that there is some discrepancy on the CT findings may be related to operator air in measurement. The good news is that the descending aorta is now measuring smaller, but not normal. We will need additional data points to understand whether there has been any significant changes in the size of the vessel.  02/12/2017  Michelle Moreno was  seen today in follow-up. In February this year she had a repeat CT scan of her aorta which showed a stable ectatic aortic root measuring 3.9 cm. She had a number of small subcentimeter nodules which were stable compared to the CT scan finding in 2007 and are considered benign by the radiologist. He  denies any worsening shortness of breath. She gets some occasional chest discomfort in the left axillary area. This is not worse with exertion or relieved by rest and is more notable oftentimes at night while laying in bed. She is noted that she gets discomfort in her legs. Sometimes with walking or laying in bed at night. She gets achiness and restlessness. She also has some prominent varicose veins of the left anterior thigh and sounds like she may have had some phlebitis associated with this with burning and tingling as well as swelling. She saw vein specialist in [place] who felt that she could have a treatment for this but wanted her to discuss with me about that cardiac implications of ablating veins in her legs. We discussed this today and in fact I informed her that if she has varicose veins, they would not be considered to use as conduits for example for coronary bypass at any point in the future. She has tried stocking use but has been intolerant of them.  02/12/2018  Michelle Moreno is seen today in follow-up.  She has been working with vein and vascular surgery including Dr. early for saphenous vein ablation.  She has had significant relief from those procedures.  She denies any chest pain or worsening shortness of breath.  Her last CT scan in 2018 showed an ectatic aorta measuring 3.9 cm.  It was not necessarily recommended to follow this annually however will probably get another CT scan next year.  Other complaints include some soreness in her hands and stiffness.  Blood pressure is better controlled now after recently changing to losartan, however she has concerns about the recall.  02/17/2019  Michelle Moreno returns today for follow-up.  She denies any chest pain or worsening shortness of breath.  Her recent lipid profile showed total cholesterol 158, triglycerides 76, HDL 85 and LDL 59.  She denies any symptomatic PVCs however they are noted on her EKG today.  She has had variable measurements  of her CT angiogram regarding an ectatic aorta.  It now measures 4.1 cm as of February 2020 however had measured 3.9 cm and prior to that was 4.3 cm.  She will have repeat testing in 1 year.  03/17/2020  Ms. Shindler seen today in follow-up.  Overall she is doing well.  In February she had a CT angiogram which showed stable aortic aneurysm size of 4.1 cm.  She is currently living in [facility] full-time and comes back occasionally for doctor visits.  Blood pressure is good today 128/94.  EKG shows sinus rhythm with PVCs.  She is unaware of the PVCs and is noted to have these in the past.  She has not had a recent lipid profile but her last study was in October 2020 showed an LDL of 59.  PMHx:  Past Medical History:  Diagnosis Date  . Asthma   . HTN (hypertension)   . Hyperlipidemia   . Lung nodule   . PVC's (premature ventricular contractions)     Past Surgical History:  Procedure Laterality Date  . HERNIA REPAIR  1998   abdominal  . TOTAL VAGINAL HYSTERECTOMY  2001    FAMHx:  Family History  Problem Relation Age of Onset  . Coronary artery disease Brother   . Heart disease Brother   . Cerebral aneurysm Sister   . Alzheimer's disease Unknown   . Hypertension Unknown   . AAA (abdominal aortic aneurysm) Mother   . Heart disease Father   . Colon cancer Neg Hx   . Stomach cancer Neg Hx     SOCHx:   reports that she quit smoking about 18 years ago. She has never used smokeless tobacco. She reports current alcohol use of about 7.0 standard drinks of alcohol per week. She reports that she does not use drugs.  ALLERGIES:  No Known Allergies  ROS: A comprehensive review of systems was negative.  HOME MEDS: Current Outpatient Medications  Medication Sig Dispense Refill  . cetirizine (ZYRTEC) 10 MG tablet Take 10 mg by mouth as needed for allergies (allergies).    . cyclobenzaprine (FLEXERIL) 10 MG tablet Take 10 mg by mouth 3 (three) times daily.    Marland Kitchen estradiol (CLIMARA -  DOSED IN MG/24 HR) 0.1 mg/24hr Place 1 patch onto the skin once a week.     . Fluticasone-Salmeterol (ADVAIR) 250-50 MCG/DOSE AEPB Inhale 1 puff into the lungs every 12 (twelve) hours.    Marland Kitchen losartan (COZAAR) 100 MG tablet TAKE 1 TABLET BY MOUTH EVERY DAY 90 tablet 0  . PROAIR HFA 108 (90 Base) MCG/ACT inhaler Inhale two (2) puffs into the lungs every four (4) to six (6) hours as needed for shortness of breath or wheezing.  3  . rosuvastatin (CRESTOR) 5 MG tablet Take 1 tablet (5 mg total) by mouth every other day. (Patient not taking: Reported on 03/17/2020) 45 tablet 3   No current facility-administered medications for this visit.    LABS/IMAGING: No results found for this or any previous visit (from the past 48 hour(s)). No results found.  VITALS: BP (!) 128/94   Pulse 92   Ht 5\' 7"  (1.702 m)   Wt 142 lb 9.6 oz (64.7 kg)   BMI 22.33 kg/m   EXAM: General appearance: alert and no distress Neck: no carotid bruit and no JVD Lungs: clear to auscultation bilaterally Heart: regular rate and rhythm, S1, S2 normal, no murmur, click, rub or gallop Abdomen: soft, non-tender; bowel sounds normal; no masses,  no organomegaly Extremities: edema Trace pedal and varicose veins noted Pulses: 2+ and symmetric Skin: Skin color, texture, turgor normal. No rashes or lesions Neurologic: GroPsych: Pleasanty normal Psych: Pleasant  EKG: Sinus rhythm PVCs at 92, possible left atrial margin, incomplete right bundle branch block, LAFB-personally reviewed  ASSESSMENT: 1. Ectatic or borderline aneurysmal ascending aorta between 3.9 and 4.3 cm -measured 4.1 cm by CTA (06/2019) 2. Abnormal EKG -asymptomatic PVCs 3. History of tobacco abuse 4. Hypertension 5. Family history of abdominal aortic aneurysm and cerebral aneurysm 6. Dyslipidemia 7. 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 CAC score of 0 (06/2014)  PLAN: 1.   Mrs. Flamm is doing well and is asymptomatic with PVCs.  Her  CTA showed a stable 4.1 cm aneurysmal aorta.  This is been stable for a number of years.  It would be more convenient for her to follow-up with the CT around the time of her office visit therefore she is agreeable to postpone it from February until October of next year which time I will see her shortly thereafter to discuss those findings.  Her cholesterol has been well controlled but needs to be reassessed.  Blood pressure  is at goal at home although diastolic was mildly elevated today.  Fortunately she had no coronary calcium in 2016.  Follow-up annually or sooner as necessary.  Pixie Casino, MD, [facility], [place] Director of the Advanced Lipid Disorders &  Cardiovascular Risk Reduction Clinic Diplomate of the American Board of Clinical Lipidology Attending Cardiologist  Direct Dial: [phone]  Fax: [phone]  Website:  [email or web address] 03/17/2020, 9:32 AM

## 2020-03-17 NOTE — Patient Instructions (Signed)
Medication Instructions:  Your physician recommends that you continue on your current medications as directed. Please refer to the Current Medication list given to you today.  *If you need a refill on your cardiac medications before your next appointment, please call your pharmacy*   Lab Work: FASTING LIPID PANEL   If you have labs (blood work) drawn today and your tests are completely normal, you will receive your results only by: Marland Kitchen MyChart Message (if you have MyChart) OR . A paper copy in the mail If you have any lab test that is abnormal or we need to change your treatment, we will call you to review the results.   Testing/Procedures: CT angiogram in October 2022 When you are contacted to schedule this, please schedule your visit with Dr. Debara Pickett soon after   Follow-Up: At [facility], you and your health needs are our priority.  As part of our continuing mission to provide you with exceptional heart care, we have created designated Provider Care Teams.  These Care Teams include your primary Cardiologist (physician) and Advanced Practice Providers (APPs -  Physician Assistants and Nurse Practitioners) who all work together to provide you with the care you need, when you need it.  We recommend signing up for the patient portal called "MyChart".  Sign up information is provided on this After Visit Summary.  MyChart is used to connect with patients for Virtual Visits (Telemedicine).  Patients are able to view lab/test results, encounter notes, upcoming appointments, etc.  Non-urgent messages can be sent to your provider as well.   To learn more about what you can do with MyChart, go to [email or web address].    Your next appointment:   12 Months  The format for your next appointment:   In Person  Provider:   You may see Pixie Casino, MD or one of the following Advanced Practice Providers on your designated Care Team:    Almyra Deforest, PA-C  Fabian Sharp, PA-C or   Roby Lofts, [place]    Other Instructions

## 2020-03-25 ENCOUNTER — Telehealth: Payer: Self-pay | Admitting: Internal Medicine

## 2020-03-25 MED ORDER — ROSUVASTATIN CALCIUM 5 MG PO TABS: 5.0000 mg | ORAL_TABLET | ORAL | 3 refills | Status: DC

## 2020-03-25 NOTE — Telephone Encounter (Signed)
Patient states she reviewed lipid panel results on MyChart and she would like a call back to go over medication recommendations. Please call.

## 2020-03-25 NOTE — Telephone Encounter (Signed)
Spoke with patient. Patient needed rosuvastatin refilled. Prescription refill sent to pharmacy as requested. No further questions at this time.

## 2020-04-20 ENCOUNTER — Other Ambulatory Visit: Payer: Self-pay | Admitting: Internal Medicine

## 2020-04-21 NOTE — Telephone Encounter (Signed)
Rx has been sent to the pharmacy electronically. ° °

## 2020-05-25 ENCOUNTER — Other Ambulatory Visit: Payer: Self-pay | Admitting: Obstetrics and Gynecology

## 2020-05-25 DIAGNOSIS — E2839 Other primary ovarian failure: Secondary | ICD-10-CM

## 2020-11-23 ENCOUNTER — Telehealth: Payer: Self-pay | Admitting: Internal Medicine

## 2020-11-23 DIAGNOSIS — E785 Hyperlipidemia, unspecified: Secondary | ICD-10-CM

## 2020-11-23 DIAGNOSIS — I712 Thoracic aortic aneurysm, without rupture, unspecified: Secondary | ICD-10-CM

## 2020-11-23 NOTE — Telephone Encounter (Signed)
Spoke with patient and scheduled for appointment with MD at [facility] on 04/06/21  She will need CT test scheduled - message sent to scheduling team to arrange  She will need BMET & fasting lipid panel prior - lab order will be mailed  MyChart message sent to patient

## 2020-11-23 NOTE — Telephone Encounter (Signed)
Patient called to schedule yearly appointment with Dr. Debara Pickett. Offering virtual appointment in December, because I did not see anything in the office for general card. Patient refused appointment and refused to see a PA. She requested a call back for another doctor recommendation at [facility] that will have appointments available in the office.

## 2020-12-16 ENCOUNTER — Telehealth: Payer: Self-pay | Admitting: Internal Medicine

## 2020-12-16 NOTE — Telephone Encounter (Signed)
Spoke with patient regarding upcoming appt (11/30), lab orders, CT test  She said she never received any call to set up CT test, called [facility] today and was told they had no record of her being a patient, although she went there last year. Advised I will seek assistance from our onsite team to arrange test  She received lab orders in the mail - explained that instructions for lab were in [place] but she said she saw nothing. Explained that BMET and fasting lipid panel would need to be done prior to CT test, which is due before her 04/06/21 visit  Re-sent her MyChart message with this info and appt location for [place] that once CT test is scheduled, will mail her appointment info/instructions

## 2020-12-16 NOTE — Telephone Encounter (Signed)
Patient wanted to talk to Dr. Lysbeth Penner Nurse Eliezer Lofts

## 2021-01-20 ENCOUNTER — Other Ambulatory Visit: Payer: Self-pay | Admitting: Internal Medicine

## 2021-03-15 ENCOUNTER — Other Ambulatory Visit (HOSPITAL_BASED_OUTPATIENT_CLINIC_OR_DEPARTMENT_OTHER): Payer: Self-pay | Admitting: Internal Medicine

## 2021-03-29 LAB — BASIC METABOLIC PANEL
BUN/Creatinine Ratio: 10 — ABNORMAL LOW (ref 12–28)
BUN: 8 mg/dL (ref 8–27)
CO2: 24 mmol/L (ref 20–29)
Calcium: 9.5 mg/dL (ref 8.7–10.3)
Chloride: 100 mmol/L (ref 96–106)
Creatinine, Ser: 0.8 mg/dL (ref 0.57–1.00)
Glucose: 121 mg/dL — ABNORMAL HIGH (ref 70–99)
Potassium: 4.1 mmol/L (ref 3.5–5.2)
Sodium: 137 mmol/L (ref 134–144)
eGFR: 79 mL/min/{1.73_m2} (ref >59)

## 2021-03-29 LAB — LIPID PANEL
Chol/HDL Ratio: 2.1 ratio (ref 0.0–4.4)
Cholesterol, Total: 176 mg/dL (ref 100–199)
HDL: 85 mg/dL (ref >39)
LDL Chol Calc (NIH): 77 mg/dL (ref 0–99)
Triglycerides: 77 mg/dL (ref 0–149)
VLDL Cholesterol Cal: 14 mg/dL (ref 5–40)

## 2021-04-04 ENCOUNTER — Other Ambulatory Visit: Payer: Self-pay

## 2021-04-04 ENCOUNTER — Ambulatory Visit
Admission: RE | Admit: 2021-04-04 | Discharge: 2021-04-04 | Disposition: A | Discharge status: Home / Self Care | Payer: Medicare Other | Source: Ambulatory Visit | Attending: Internal Medicine | Admitting: Internal Medicine

## 2021-04-04 DIAGNOSIS — I712 Thoracic aortic aneurysm, without rupture, unspecified: Secondary | ICD-10-CM

## 2021-04-04 MED ORDER — IOPAMIDOL (ISOVUE-370) INJECTION 76%
75.0000 mL | Freq: Once | INTRAVENOUS | Status: AC | PRN
  Administered 2021-04-04: 75 mL via INTRAVENOUS

## 2021-04-06 ENCOUNTER — Ambulatory Visit (INDEPENDENT_AMBULATORY_CARE_PROVIDER_SITE_OTHER): Payer: Medicare Other | Admitting: Internal Medicine

## 2021-04-06 ENCOUNTER — Encounter (HOSPITAL_BASED_OUTPATIENT_CLINIC_OR_DEPARTMENT_OTHER): Payer: Self-pay | Admitting: Internal Medicine

## 2021-04-06 ENCOUNTER — Other Ambulatory Visit: Payer: Self-pay

## 2021-04-06 VITALS — BP 128/98 | HR 96 | Ht 67.0 in | Wt 142.0 lb

## 2021-04-06 DIAGNOSIS — I712 Thoracic aortic aneurysm, without rupture, unspecified: Secondary | ICD-10-CM

## 2021-04-06 DIAGNOSIS — E785 Hyperlipidemia, unspecified: Secondary | ICD-10-CM | POA: Diagnosis not present

## 2021-04-06 DIAGNOSIS — I10 Essential (primary) hypertension: Secondary | ICD-10-CM

## 2021-04-06 MED ORDER — HYDROCHLOROTHIAZIDE 12.5 MG PO CAPS: 12.5000 mg | ORAL_CAPSULE | Freq: Every day | ORAL | 3 refills | Status: DC

## 2021-04-06 NOTE — Patient Instructions (Addendum)
Medication Instructions:  START hydrochlorothiazide 12.5mg  daily  *If you need a refill on your cardiac medications before your next appointment, please call your pharmacy*   Lab Work: BMET (non-fasting lab) after 2 weeks on new medication   CMET & Lipid Panel (fasting) in 1 year -- before your next appointment   If you have labs (blood work) drawn today and your tests are completely normal, you will receive your results only by: [place] (if you have MyChart) OR A paper copy in the mail If you have any lab test that is abnormal or we need to change your treatment, we will call you to review the results.   Testing/Procedures: CT angiogram in 1 year    Follow-Up: At [facility], you and your health needs are our priority.  As part of our continuing mission to provide you with exceptional heart care, we have created designated Provider Care Teams.  These Care Teams include your primary Cardiologist (physician) and Advanced Practice Providers (APPs -  Physician Assistants and Nurse Practitioners) who all work together to provide you with the care you need, when you need it.  We recommend signing up for the patient portal called "MyChart".  Sign up information is provided on this After Visit Summary.  MyChart is used to connect with patients for Virtual Visits (Telemedicine).  Patients are able to view lab/test results, encounter notes, upcoming appointments, etc.  Non-urgent messages can be sent to your provider as well.   To learn more about what you can do with MyChart, go to [email or web address].    Your next appointment:   12 month(s)  The format for your next appointment:   In Person  Provider:   Pixie Casino, MD {   Dr. Debara Pickett would like you to check your BP at home Please call with reading in about 2 weeks or send via [place]: Rest 5 minutes before taking your blood pressure. Don't smoke or drink caffeinated beverages for  at least 30 minutes before. Take your blood pressure before (not after) you eat. Sit comfortably with your back supported and both feet on the floor (don't cross your legs). Elevate your arm to heart level on a table or a desk. Use the proper sized cuff. It should fit smoothly and snugly around your bare upper arm. There should be enough room to slip a fingertip under the cuff. The bottom edge of the cuff should be 1 inch above the crease of the elbow. Ideally, take 3 measurements at one sitting and record the average.

## 2021-04-06 NOTE — Progress Notes (Signed)
OFFICE NOTE  Chief Complaint:  Routine follow-up  Primary Care Physician: Isaias Sakai, DO  HPI:  Michelle Moreno is a pleasant 70 year old female coming referred to me by Dr. Edwyna Ready. Her primary care provider is Dr. Lisbeth Ply. Her family history is very significant for heart disease. She tells me that her mother had a abdominal aortic aneurysm and is deceased. Her father heart disease. She also has a brother has heart disease at a young age and a sister who had a cerebral aneurysm and died. Personally she has no known coronary history. She does have a history of smoking in the past and risk factors including hypertension, age, dyslipidemia and is had PVCs. She denies any chest pain or worsening shortness of breath with exertion however is not particularly active. She is going to start doing yoga soon and wants to know if it's okay for her to start exercise. She also has concerns about her mother who had abdominal aortic aneurysm. Given her history of smoking and hypertension as well as family history she may be at risk for this. In addition, she is wondering whether she should have screening for cerebral aneurysms. Current guidelines recommend screening for cerebral aneurysms in patients who have 2 first-degree relatives with cerebral aneurysms. The incidence of cerebral aneurysm in patients with one first-degree relative is very low around 1%, therefore screening is not likely to be beneficial.  I saw Michelle Moreno back in the office today. 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 This was also noted on the radiology over read including a number of pulmonary nodules and hypodensities in the liver consistent with probably cysts. She also underwent abdominal ultrasound which was negative for any abdominal aortic aneurysm. While there was no coronary calcium  there is atherosclerosis of the abdominal aorta that was noted. Laboratory work was reviewed from her primary care provider including a total cholesterol 240 with LDL greater than 120.   Michelle Moreno returns today for follow-up. She's had some occasional episodes of discomfort in her chest at night particularly laying down. I did not suspect this is cardiac given her recent 0 coronary calcium score. She does have an ascending aortic aneurysm measuring 4.3 cm and is ordered for repeat CT of the aorta next February. Blood pressure today was very elevated in the office at 158/102. She reports significant whitecoat hypertension. She did take her valsartan and today. I rechecked her blood pressure at the end of the visit which should come down to 145/98. Still elevated.  02/23/2016  Michelle Moreno returns today for follow-up of her ascending aortic aneurysm. She underwent another CT scan of her aorta in January of this year. This demonstrated an ectatic/bordering on aneurysmal ascending thoracic aorta with a maximum diameter 3.9 cm. The aortic root was measured at 3.4 cm without effacement at the sinotubular junction. This contrasts an earlier CT finding of a dilated root to 4.3 cm. This is cause Michelle Moreno significant anxiety. I tried to explain the results to her today and the fact that there is some discrepancy on the CT findings may be related to operator air in measurement. The good news is that the descending aorta is now measuring smaller, but not normal. We will need additional data points to understand whether there has been any significant changes in the size of the vessel.  02/12/2017  Michelle Moreno was  seen today in follow-up. In February this year she had a repeat CT scan of her aorta which showed a stable ectatic aortic root measuring 3.9 cm. She had a number of small subcentimeter nodules which were stable compared to the CT scan finding in 2007 and are considered benign by the radiologist. He  denies any worsening shortness of breath. She gets some occasional chest discomfort in the left axillary area. This is not worse with exertion or relieved by rest and is more notable oftentimes at night while laying in bed. She is noted that she gets discomfort in her legs. Sometimes with walking or laying in bed at night. She gets achiness and restlessness. She also has some prominent varicose veins of the left anterior thigh and sounds like she may have had some phlebitis associated with this with burning and tingling as well as swelling. She saw vein specialist in [place] who felt that she could have a treatment for this but wanted her to discuss with me about that cardiac implications of ablating veins in her legs. We discussed this today and in fact I informed her that if she has varicose veins, they would not be considered to use as conduits for example for coronary bypass at any point in the future. She has tried stocking use but has been intolerant of them.  02/12/2018  Michelle Moreno is seen today in follow-up.  She has been working with vein and vascular surgery including Dr. early for saphenous vein ablation.  She has had significant relief from those procedures.  She denies any chest pain or worsening shortness of breath.  Her last CT scan in 2018 showed an ectatic aorta measuring 3.9 cm.  It was not necessarily recommended to follow this annually however will probably get another CT scan next year.  Other complaints include some soreness in her hands and stiffness.  Blood pressure is better controlled now after recently changing to losartan, however she has concerns about the recall.  02/17/2019  Michelle Moreno returns today for follow-up.  She denies any chest pain or worsening shortness of breath.  Her recent lipid profile showed total cholesterol 158, triglycerides 76, HDL 85 and LDL 59.  She denies any symptomatic PVCs however they are noted on her EKG today.  She has had variable measurements  of her CT angiogram regarding an ectatic aorta.  It now measures 4.1 cm as of February 2020 however had measured 3.9 cm and prior to that was 4.3 cm.  She will have repeat testing in 1 year.  03/17/2020  Michelle Moreno seen today in follow-up.  Overall she is doing well.  In February she had a CT angiogram which showed stable aortic aneurysm size of 4.1 cm.  She is currently living in [facility] full-time and comes back occasionally for doctor visits.  Blood pressure is good today 128/94.  EKG shows sinus rhythm with PVCs.  She is unaware of the PVCs and is noted to have these in the past.  She has not had a recent lipid profile but her last study was in October 2020 showed an LDL of 59.  04/07/2019  Michelle Moreno returns today for follow-up.  Overall she is doing well.  Her blood pressure again was a little elevated, particularly the diastolic blood pressure was 98.  Looking back her diastolic blood pressure has run in the 90s for a while although systolic pressures seem well controlled.  She denies any chest pain or shortness of breath.  She is  on low-dose rosuvastatin and total cholesterol is 176, HDL 85, LDL now 77, down from 132 and triglycerides 77.  Just had a repeat CT angiogram of her aorta which showed a stable dilated aorta at 4.0 cm.  PMHx:  Past Medical History:  Diagnosis Date   Asthma    HTN (hypertension)    Hyperlipidemia    Lung nodule    PVC's (premature ventricular contractions)     Past Surgical History:  Procedure Laterality Date   HERNIA REPAIR  1998   abdominal   TOTAL VAGINAL HYSTERECTOMY  2001    FAMHx:  Family History  Problem Relation Age of Onset   Coronary artery disease Brother    Heart disease Brother    Cerebral aneurysm Sister    Alzheimer's disease Unknown    Hypertension Unknown    AAA (abdominal aortic aneurysm) Mother    Heart disease Father    Colon cancer Neg Hx    Stomach cancer Neg Hx     SOCHx:   reports that she quit smoking about  19 years ago. Her smoking use included cigarettes. She has never used smokeless tobacco. She reports current alcohol use of about 7.0 standard drinks per week. She reports that she does not use drugs.  ALLERGIES:  No Known Allergies  ROS: A comprehensive review of systems was negative.  HOME MEDS: Current Outpatient Medications  Medication Sig Dispense Refill   cetirizine (ZYRTEC) 10 MG tablet Take 10 mg by mouth as needed for allergies (allergies).     estradiol (CLIMARA - DOSED IN MG/24 HR) 0.1 mg/24hr Place 1 patch onto the skin once a week.      Fluticasone-Salmeterol (ADVAIR) 250-50 MCG/DOSE AEPB Inhale 1 puff into the lungs every 12 (twelve) hours.     losartan (COZAAR) 100 MG tablet TAKE 1 TABLET BY MOUTH EVERY DAY 90 tablet 2   PROAIR HFA 108 (90 Base) MCG/ACT inhaler Inhale two (2) puffs into the lungs every four (4) to six (6) hours as needed for shortness of breath or wheezing.  3   rosuvastatin (CRESTOR) 5 MG tablet TAKE 1 TABLET BY MOUTH EVERY OTHER DAY 45 tablet 0   cyclobenzaprine (FLEXERIL) 10 MG tablet Take 10 mg by mouth 3 (three) times daily. (Patient not taking: Reported on 04/06/2021)     No current facility-administered medications for this visit.    LABS/IMAGING: No results found for this or any previous visit (from the past 48 hour(s)). CT ANGIO CHEST AORTA W/CM & OR WO/CM  Result Date: 04/04/2021 CLINICAL DATA:  Thoracic aortic aneurysm, follow-up EXAM: CT ANGIOGRAPHY CHEST WITH CONTRAST TECHNIQUE: Multidetector CT imaging of the chest was performed using the standard protocol during bolus administration of intravenous contrast. Multiplanar CT image reconstructions and MIPs were obtained to evaluate the vascular anatomy. CONTRAST:  52mL ISOVUE-370 IOPAMIDOL (ISOVUE-370) INJECTION 76% COMPARISON:  06/30/2019 and previous FINDINGS: Cardiovascular: Heart size normal. No pericardial effusion. Satisfactory opacification of pulmonary arteries noted, and there is no  evidence of pulmonary emboli. Good contrast opacification of the thoracic aorta. No dissection or stenosis. Classic 3 vessel brachiocephalic arterial origin anatomy. There is mild plaque in the proximal left subclavian artery resulting in only mild stenosis. Mild partially calcified plaque in the descending thoracic aorta. Aortic Root: --Valve: 2.9 cm --Sinuses: 3.5 cm --Sinotubular Junction: 3.1 cm Limitations by motion: Moderate Thoracic Aorta: --Ascending Aorta: 4 cm (previously 4.1) --Aortic Arch: 3 cm --Descending Aorta: 3.2 Visualized proximal abdominal aorta normal in caliber. 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 Mediastinum/Nodes: No mass or adenopathy. Lungs/Pleura: No pleural effusion. Minimal dependent atelectasis or subpleural scarring in both lower lobes. 4 mm subpleural nodule (Im106,Se11) , stable since 06/04/2015 and presumably benign. No new nodule or airspace disease. Upper Abdomen: No acute findings. Musculoskeletal: No chest wall abnormality. No acute or significant osseous findings. Review of the MIP images confirms the above findings. IMPRESSION: 1. Stable 4 cm thoracic aortic aneurysm without complicating features. Recommend annual imaging followup by CTA or MRA. This recommendation follows 2010 ACCF/AHA/AATS/ACR/ASA/SCA/SCAI/SIR/STS/SVM Guidelines for the Diagnosis and Management of Patients with Thoracic Aortic Disease. Circulation. 2010; 121: O270-J500. Aortic aneurysm NOS (ICD10-I71.9). 2.  Aortic Atherosclerosis (ICD10-170.0). Electronically Signed   By: Lucrezia Europe M.D.   On: 04/04/2021 12:45    VITALS: BP (!) 128/98   Pulse 96   Ht 5\' 7"  (1.702 m)   Wt 142 lb (64.4 kg)   SpO2 98%   BMI 22.24 kg/m   EXAM: General appearance: alert and no distress Neck: no carotid bruit and no JVD Lungs: clear to auscultation bilaterally Heart: regular rate and rhythm, S1, S2 normal, no murmur, click, rub or gallop Abdomen: soft, non-tender; bowel sounds  normal; no masses,  no organomegaly Extremities: edema Trace pedal and varicose veins noted Pulses: 2+ and symmetric Skin: Skin color, texture, turgor normal. 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   Michelle Moreno has a stable aneurysmal ascending aorta measuring 4.0 cm by recent CTA.  This is varied somewhat between 3.9 and 4.3 cm.  It does not seem to be worsening.  She is noted to have some diastolic hypertension.  She is on high-dose losartan.  I like to add in HCTZ 12.5 mg daily.  I encouraged continued hydration.  We will check a metabolic profile in about 2 weeks.  If stable and well-tolerated and her numbers are improving, could consider combining her losartan and HCTZ.  She should report back to [place] with her numbers.  Follow-up annually or sooner as necessary.  Plan on repeating her CT next year.  Pixie Casino, MD, [facility], [place] Director of the Advanced Lipid Disorders &  Cardiovascular Risk Reduction Clinic Diplomate of the American Board of Clinical Lipidology Attending Cardiologist  Direct Dial: [phone]  Fax: [phone]  Website:  [email or web address] 04/06/2021, 11:21 AM

## 2021-04-13 NOTE — Addendum Note (Signed)
Addended by: Fidel Levy on: 04/13/2021 02:45 PM   Modules accepted: Orders

## 2021-06-18 ENCOUNTER — Other Ambulatory Visit (HOSPITAL_BASED_OUTPATIENT_CLINIC_OR_DEPARTMENT_OTHER): Payer: Self-pay | Admitting: Internal Medicine

## 2021-07-05 ENCOUNTER — Other Ambulatory Visit (HOSPITAL_BASED_OUTPATIENT_CLINIC_OR_DEPARTMENT_OTHER): Payer: Self-pay | Admitting: Internal Medicine

## 2021-09-14 ENCOUNTER — Other Ambulatory Visit (HOSPITAL_BASED_OUTPATIENT_CLINIC_OR_DEPARTMENT_OTHER): Payer: Self-pay | Admitting: Internal Medicine

## 2021-10-16 ENCOUNTER — Other Ambulatory Visit: Payer: Self-pay | Admitting: Internal Medicine

## 2021-11-01 ENCOUNTER — Telehealth: Payer: Self-pay | Admitting: Internal Medicine

## 2021-11-01 NOTE — Telephone Encounter (Signed)
-  Returned call to pt -She state she hasn't heard anything regarding scheduling her yearly follow up and CTA Aorta. Will forward to scheduling to schedule  -Pt also report she did not start HCTZ as instructed due to skin cancer surgery. Pt state she didn't want to start any new medication during that time period. -She also report she read HCTZ can increase the risk of skin cancer and questioning if MD feels she should take it. -Recent BP 120/80

## 2022-01-02 ENCOUNTER — Encounter: Payer: Self-pay | Admitting: Internal Medicine

## 2022-01-02 NOTE — Telephone Encounter (Signed)
error 

## 2022-02-07 ENCOUNTER — Other Ambulatory Visit: Payer: Self-pay | Admitting: Internal Medicine

## 2022-02-07 DIAGNOSIS — E785 Hyperlipidemia, unspecified: Secondary | ICD-10-CM

## 2022-03-10 ENCOUNTER — Other Ambulatory Visit: Payer: Medicare Other

## 2022-03-28 LAB — COMPREHENSIVE METABOLIC PANEL
ALT: 11 IU/L (ref 0–32)
AST: 18 IU/L (ref 0–40)
Albumin/Globulin Ratio: 1.9 (ref 1.2–2.2)
Albumin: 4.4 g/dL (ref 3.8–4.8)
Alkaline Phosphatase: 45 IU/L (ref 44–121)
BUN/Creatinine Ratio: 17 (ref 12–28)
BUN: 11 mg/dL (ref 8–27)
Bilirubin Total: 0.5 mg/dL (ref 0.0–1.2)
CO2: 22 mmol/L (ref 20–29)
Calcium: 9.3 mg/dL (ref 8.7–10.3)
Chloride: 102 mmol/L (ref 96–106)
Creatinine, Ser: 0.65 mg/dL (ref 0.57–1.00)
Globulin, Total: 2.3 g/dL (ref 1.5–4.5)
Glucose: 107 mg/dL — ABNORMAL HIGH (ref 70–99)
Potassium: 3.7 mmol/L (ref 3.5–5.2)
Sodium: 139 mmol/L (ref 134–144)
Total Protein: 6.7 g/dL (ref 6.0–8.5)
eGFR: 94 mL/min/{1.73_m2} (ref >59)

## 2022-03-28 LAB — LIPID PANEL
Chol/HDL Ratio: 2.2 ratio (ref 0.0–4.4)
Cholesterol, Total: 189 mg/dL (ref 100–199)
HDL: 86 mg/dL (ref >39)
LDL Chol Calc (NIH): 86 mg/dL (ref 0–99)
Triglycerides: 96 mg/dL (ref 0–149)
VLDL Cholesterol Cal: 17 mg/dL (ref 5–40)

## 2022-04-04 ENCOUNTER — Ambulatory Visit
Admission: RE | Admit: 2022-04-04 | Discharge: 2022-04-04 | Disposition: A | Discharge status: Home / Self Care | Payer: Medicare Other | Source: Ambulatory Visit | Attending: Internal Medicine | Admitting: Internal Medicine

## 2022-04-04 ENCOUNTER — Telehealth: Payer: Self-pay | Admitting: Internal Medicine

## 2022-04-04 DIAGNOSIS — I712 Thoracic aortic aneurysm, without rupture, unspecified: Secondary | ICD-10-CM

## 2022-04-04 MED ORDER — IOPAMIDOL (ISOVUE-370) INJECTION 76%
75.0000 mL | Freq: Once | INTRAVENOUS | Status: AC | PRN
  Administered 2022-04-04: 75 mL via INTRAVENOUS

## 2022-04-04 NOTE — Telephone Encounter (Signed)
[facility] nurse notified

## 2022-04-04 NOTE — Telephone Encounter (Signed)
FYI--Michelle Moreno with [facility] would like to make sure we are able to access and review CT results. I informed her that we are able to access the results, but they have not been reviewed by MD. Michelle Moreno was thankful for this information. She states a call back is not necessary, she just wanted to make sure that the results would be reviewed.

## 2022-04-06 ENCOUNTER — Ambulatory Visit: Payer: Medicare Other | Attending: Internal Medicine | Admitting: Internal Medicine

## 2022-04-06 ENCOUNTER — Encounter: Payer: Self-pay | Admitting: Internal Medicine

## 2022-04-06 VITALS — BP 134/83 | HR 95 | Ht 67.0 in | Wt 141.0 lb

## 2022-04-06 DIAGNOSIS — I712 Thoracic aortic aneurysm, without rupture, unspecified: Secondary | ICD-10-CM | POA: Diagnosis present

## 2022-04-06 DIAGNOSIS — I10 Essential (primary) hypertension: Secondary | ICD-10-CM | POA: Diagnosis not present

## 2022-04-06 DIAGNOSIS — E785 Hyperlipidemia, unspecified: Secondary | ICD-10-CM | POA: Diagnosis present

## 2022-04-06 DIAGNOSIS — Z87891 Personal history of nicotine dependence: Secondary | ICD-10-CM | POA: Diagnosis not present

## 2022-04-06 DIAGNOSIS — R918 Other nonspecific abnormal finding of lung field: Secondary | ICD-10-CM

## 2022-04-06 NOTE — Patient Instructions (Signed)
Medication Instructions:  NO CHANGES  *If you need a refill on your cardiac medications before your next appointment, please call your pharmacy*  Follow-Up: At [facility], you and your health needs are our priority.  As part of our continuing mission to provide you with exceptional heart care, we have created designated Provider Care Teams.  These Care Teams include your primary Cardiologist (physician) and Advanced Practice Providers (APPs -  Physician Assistants and Nurse Practitioners) who all work together to provide you with the care you need, when you need it.  We recommend signing up for the patient portal called "MyChart".  Sign up information is provided on this After Visit Summary.  MyChart is used to connect with patients for Virtual Visits (Telemedicine).  Patients are able to view lab/test results, encounter notes, upcoming appointments, etc.  Non-urgent messages can be sent to your provider as well.   To learn more about what you can do with MyChart, go to [email or web address].    Your next appointment:   12 month(s)  The format for your next appointment:   In Person  Provider:   Pixie Casino, MD     You have been referred to [facility]. They will call you with an appointment.

## 2022-04-06 NOTE — Progress Notes (Signed)
OFFICE NOTE  Chief Complaint:  Routine follow-up  Primary Care Physician: Isaias Sakai, DO  HPI:  Michelle Moreno is a pleasant 71 year old female coming referred to me by Dr. Edwyna Ready. Her primary care provider is Dr. Lisbeth Ply. Her family history is very significant for heart disease. She tells me that her mother had a abdominal aortic aneurysm and is deceased. Her father heart disease. She also has a brother has heart disease at a young age and a sister who had a cerebral aneurysm and died. Personally she has no known coronary history. She does have a history of smoking in the past and risk factors including hypertension, age, dyslipidemia and is had PVCs. She denies any chest pain or worsening shortness of breath with exertion however is not particularly active. She is going to start doing yoga soon and wants to know if it's okay for her to start exercise. She also has concerns about her mother who had abdominal aortic aneurysm. Given her history of smoking and hypertension as well as family history she may be at risk for this. In addition, she is wondering whether she should have screening for cerebral aneurysms. Current guidelines recommend screening for cerebral aneurysms in patients who have 2 first-degree relatives with cerebral aneurysms. The incidence of cerebral aneurysm in patients with one first-degree relative is very low around 1%, therefore screening is not likely to be beneficial.  I saw Michelle Moreno back in the office today. 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 This was also noted on the radiology over read including a number of pulmonary nodules and hypodensities in the liver consistent with probably cysts. She also underwent abdominal ultrasound which was negative for any abdominal aortic aneurysm. While there was no coronary calcium  there is atherosclerosis of the abdominal aorta that was noted. Laboratory work was reviewed from her primary care provider including a total cholesterol 240 with LDL greater than 120.   Michelle Moreno returns today for follow-up. She's had some occasional episodes of discomfort in her chest at night particularly laying down. I did not suspect this is cardiac given her recent 0 coronary calcium score. She does have an ascending aortic aneurysm measuring 4.3 cm and is ordered for repeat CT of the aorta next February. Blood pressure today was very elevated in the office at 158/102. She reports significant whitecoat hypertension. She did take her valsartan and today. I rechecked her blood pressure at the end of the visit which should come down to 145/98. Still elevated.  02/23/2016  Michelle Moreno returns today for follow-up of her ascending aortic aneurysm. She underwent another CT scan of her aorta in January of this year. This demonstrated an ectatic/bordering on aneurysmal ascending thoracic aorta with a maximum diameter 3.9 cm. The aortic root was measured at 3.4 cm without effacement at the sinotubular junction. This contrasts an earlier CT finding of a dilated root to 4.3 cm. This is cause Michelle Moreno significant anxiety. I tried to explain the results to her today and the fact that there is some discrepancy on the CT findings may be related to operator air in measurement. The good news is that the descending aorta is now measuring smaller, but not normal. We will need additional data points to understand whether there has been any significant changes in the size of the vessel.  02/12/2017  Michelle Moreno  was seen today in follow-up. In February this year she had a repeat CT scan of her aorta which showed a stable ectatic aortic root measuring 3.9 cm. She had a number of small subcentimeter nodules which were stable compared to the CT scan finding in 2007 and are considered benign by the radiologist. He  denies any worsening shortness of breath. She gets some occasional chest discomfort in the left axillary area. This is not worse with exertion or relieved by rest and is more notable oftentimes at night while laying in bed. She is noted that she gets discomfort in her legs. Sometimes with walking or laying in bed at night. She gets achiness and restlessness. She also has some prominent varicose veins of the left anterior thigh and sounds like she may have had some phlebitis associated with this with burning and tingling as well as swelling. She saw vein specialist in [place] who felt that she could have a treatment for this but wanted her to discuss with me about that cardiac implications of ablating veins in her legs. We discussed this today and in fact I informed her that if she has varicose veins, they would not be considered to use as conduits for example for coronary bypass at any point in the future. She has tried stocking use but has been intolerant of them.  02/12/2018  Michelle Moreno is seen today in follow-up.  She has been working with vein and vascular surgery including Dr. early for saphenous vein ablation.  She has had significant relief from those procedures.  She denies any chest pain or worsening shortness of breath.  Her last CT scan in 2018 showed an ectatic aorta measuring 3.9 cm.  It was not necessarily recommended to follow this annually however will probably get another CT scan next year.  Other complaints include some soreness in her hands and stiffness.  Blood pressure is better controlled now after recently changing to losartan, however she has concerns about the recall.  02/17/2019  Michelle Moreno returns today for follow-up.  She denies any chest pain or worsening shortness of breath.  Her recent lipid profile showed total cholesterol 158, triglycerides 76, HDL 85 and LDL 59.  She denies any symptomatic PVCs however they are noted on her EKG today.  She has had variable measurements  of her CT angiogram regarding an ectatic aorta.  It now measures 4.1 cm as of February 2020 however had measured 3.9 cm and prior to that was 4.3 cm.  She will have repeat testing in 1 year.  03/17/2020  Michelle Moreno seen today in follow-up.  Overall she is doing well.  In February she had a CT angiogram which showed stable aortic aneurysm size of 4.1 cm.  She is currently living in [facility] full-time and comes back occasionally for doctor visits.  Blood pressure is good today 128/94.  EKG shows sinus rhythm with PVCs.  She is unaware of the PVCs and is noted to have these in the past.  She has not had a recent lipid profile but her last study was in October 2020 showed an LDL of 59.  04/07/2019  Michelle Moreno returns today for follow-up.  Overall she is doing well.  Her blood pressure again was a little elevated, particularly the diastolic blood pressure was 98.  Looking back her diastolic blood pressure has run in the 90s for a while although systolic pressures seem well controlled.  She denies any chest pain or shortness of breath.  She  is on low-dose rosuvastatin and total cholesterol is 176, HDL 85, LDL now 77, down from 132 and triglycerides 77.  Just had a repeat CT angiogram of her aorta which showed a stable dilated aorta at 4.0 cm.  04/06/2022  Michelle Moreno is seen today for routine follow-up.  She had a recent repeat CT scan to assess the size of her aorta.  The CT scan showed stable thoracic aortic aneurysm at 4.0 cm.  There was a new finding of a 2.2 x 1.5 cm more solid appearing/groundglass consolidative area in the right upper lobe.  Findings concerning for neoplasm, with infection being less likely.  Today she reports feeling pretty well.  She has not had any recent unexpected weight loss.  She has not had any upper respiratory symptoms, fever, cough or other associated findings.  She does have a smoking history of about 15 pack years and quit about 20 years ago.  PMHx:  Past  Medical History:  Diagnosis Date   Asthma    HTN (hypertension)    Hyperlipidemia    Lung nodule    PVC's (premature ventricular contractions)     Past Surgical History:  Procedure Laterality Date   HERNIA REPAIR  1998   abdominal   TOTAL VAGINAL HYSTERECTOMY  2001    FAMHx:  Family History  Problem Relation Age of Onset   Coronary artery disease Brother    Heart disease Brother    Cerebral aneurysm Sister    Alzheimer's disease Unknown    Hypertension Unknown    AAA (abdominal aortic aneurysm) Mother    Heart disease Father    Colon cancer Neg Hx    Stomach cancer Neg Hx     SOCHx:   reports that she quit smoking about 20 years ago. Her smoking use included cigarettes. She has never used smokeless tobacco. She reports current alcohol use of about 7.0 standard drinks of alcohol per week. She reports that she does not use drugs.  ALLERGIES:  No Known Allergies  ROS: A comprehensive review of systems was negative.  HOME MEDS: Current Outpatient Medications  Medication Sig Dispense Refill   cetirizine (ZYRTEC) 10 MG tablet Take 10 mg by mouth as needed for allergies (allergies).     estradiol (CLIMARA - DOSED IN MG/24 HR) 0.1 mg/24hr Place 1 patch onto the skin once a week.      Fluticasone-Salmeterol (ADVAIR) 250-50 MCG/DOSE AEPB Inhale 1 puff into the lungs every 12 (twelve) hours.     losartan (COZAAR) 100 MG tablet TAKE 1 TABLET BY MOUTH EVERY DAY 90 tablet 2   PROAIR HFA 108 (90 Base) MCG/ACT inhaler Inhale two (2) puffs into the lungs every four (4) to six (6) hours as needed for shortness of breath or wheezing.  3   rosuvastatin (CRESTOR) 5 MG tablet TAKE 1 TABLET BY MOUTH EVERY OTHER DAY 90 tablet 1   No current facility-administered medications for this visit.    LABS/IMAGING: No results found for this or any previous visit (from the past 48 hour(s)). No results found.  VITALS: BP 134/83   Pulse 95   Ht '5\' 7"'$  (1.702 m)   Wt 141 lb (64 kg)   SpO2 98%    BMI 22.08 kg/m   EXAM: General appearance: alert and no distress Neck: no carotid bruit and no JVD Lungs: clear to auscultation bilaterally Heart: regular rate and rhythm, S1, S2 normal, no murmur, click, rub or gallop Abdomen: soft, non-tender; bowel sounds normal; no masses,  no organomegaly Extremities: edema Trace pedal and varicose veins noted Pulses: 2+ and symmetric Skin: Skin color, texture, turgor normal. 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   Mrs. Pizzi has had a stable ascending aortic aneurysm.  Unfortunately she has a new diagnosis of mass in the right upper lobe.  This is concerning for neoplasm but could less likely be infection.  I would like her to see our thoracic pulmonologist to further make a diagnosis soon as possible.  This could be complicated since she lives at [facility] but does have the ability to stay up here if needed.  Will try to place a urgent consultation.  Otherwise she seems pretty stable.  Blood pressure is well-controlled.  She was supposed to be on hydrochlorothiazide but never took the medication.  She says in general it has been better controlled.  Cholesterol is up slightly but LDL now in the 80s.  Follow-up with me annually or sooner as necessary.   Pixie Casino, MD, [facility], [place] Director of the  Advanced Lipid Disorders &  Cardiovascular Risk Reduction Clinic Diplomate of the American Board of Clinical Lipidology Attending Cardiologist  Direct Dial: [phone]  Fax: [phone]  Website:  [email or web address] Campione 04/06/2022, 9:44 AM

## 2022-04-13 ENCOUNTER — Encounter: Payer: Self-pay | Admitting: Emergency Medicine

## 2022-04-13 ENCOUNTER — Ambulatory Visit (INDEPENDENT_AMBULATORY_CARE_PROVIDER_SITE_OTHER): Payer: Medicare Other | Admitting: Emergency Medicine

## 2022-04-13 ENCOUNTER — Encounter: Payer: Medicare Other | Admitting: Pulmonary Disease

## 2022-04-13 VITALS — BP 130/74 | HR 87 | Temp 97.8°F | Ht 67.0 in | Wt 138.0 lb

## 2022-04-13 DIAGNOSIS — R911 Solitary pulmonary nodule: Secondary | ICD-10-CM

## 2022-04-13 DIAGNOSIS — Z006 Encounter for examination for normal comparison and control in clinical research program: Secondary | ICD-10-CM

## 2022-04-13 NOTE — Assessment & Plan Note (Addendum)
Mixed density pulmonary nodule, new compared with 1 year prior.  High suspicion for possible adenocarcinoma.  Will plan to perform PFT, PET scan and then determine next best diagnostics.  She may be a candidate for primary resection.  Alternatively she may decide to pursue navigational bronchoscopy to have a tissue diagnosis before committing to surgery.  She is interested in participation in the [place] study for nodule risk stratification.  We will give her information about this.  We reviewed the CT scan of the chest today. We will arrange for a PET scan to be done at the end of December We will arrange for pulmonary function testing to be done next available We will review your studies and decide next steps in evaluation of your right upper lobe nodule We discussed a clinical trial to help risk stratify pulmonary nodules.  Will give you more information about this today. Follow Dr. Lamonte Sakai next available after your PET scan so we can review the results together and decide next steps.

## 2022-04-13 NOTE — Progress Notes (Signed)
Subjective:    Patient ID: Michelle Moreno, female    DOB: 1950-08-24, 71 y.o.   MRN: [id number]  HPI 71 year old former smoker (20+ pack years) with history of hypertension, hyperlipidemia, PVC, asthma that was diagnosed.  She is followed by Dr. Debara Pickett for a thoracic aortic aneurysm.  She underwent CT scan of the chest to evaluate her TAA on 04/04/2022.  She is referred today to discuss that CT scan. She describes nasal congestion and drainage and some UA cough. No fevers sweats, fevers. She has lost about 9 lbs in 1 year. Good functional capacity - she can get SOB with heavier activity, chores.   CT-angio chest 04/04/2022 reviewed by me, shows stable 4 cm TAA, borderline enlarged subcarinal nodes, stable 1.5 cm thyroid nodule.  There is a focal area of consolidation and some surrounding groundglass density in the inferior right upper lobe 2.2 x 1.5 cm.  The area is completely new compared with CT scan 1 year prior.   Review of Systems As per HPI  Past Medical History:  Diagnosis Date   Asthma    HTN (hypertension)    Hyperlipidemia    Lung nodule    PVC's (premature ventricular contractions)      Family History  Problem Relation Age of Onset   Coronary artery disease Brother    Heart disease Brother    Cerebral aneurysm Sister    Alzheimer's disease Unknown    Hypertension Unknown    AAA (abdominal aortic aneurysm) Mother    Heart disease Father    Colon cancer Neg Hx    Stomach cancer Neg Hx      Social History   Socioeconomic History   Marital status: Married    Spouse name: Not on file   Number of children: 1   Years of education: Not on file   Highest education level: Not on file  Occupational History   Occupation: catering  Tobacco Use   Smoking status: Former    Types: Cigarettes    Quit date: 05/08/2001    Years since quitting: 20.9   Smokeless tobacco: Never  Vaping Use   Vaping Use: Never used  Substance and Sexual Activity   Alcohol use: Yes     Alcohol/week: 7.0 standard drinks of alcohol    Types: 7 Glasses of wine per week   Drug use: No   Sexual activity: Not on file  Other Topics Concern   Not on file  Social History Narrative   Not on file   Social Determinants of Health   Financial Resource Strain: Not on file  Food Insecurity: Not on file  Transportation Needs: Not on file  Physical Activity: Not on file  Stress: Not on file  Social Connections: Not on file  Intimate Partner Violence: Not on file    She has moved to [facility], retired  She has worked in Teacher, early years/pre, 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   No Known Allergies   Outpatient Medications Prior to Visit  Medication Sig Dispense Refill   cetirizine (ZYRTEC) 10 MG tablet Take 10 mg by mouth as needed for allergies (allergies).     estradiol (CLIMARA - DOSED IN MG/24 HR) 0.1 mg/24hr Place 1 patch onto the skin once a week.      Fluticasone-Salmeterol (ADVAIR) 250-50 MCG/DOSE AEPB Inhale 1 puff into the lungs daily.     losartan (COZAAR) 100 MG tablet TAKE 1 TABLET BY MOUTH EVERY DAY 90  tablet 2   PROAIR HFA 108 (90 Base) MCG/ACT inhaler Inhale two (2) puffs into the lungs every four (4) to six (6) hours as needed for shortness of breath or wheezing.  3   rosuvastatin (CRESTOR) 5 MG tablet TAKE 1 TABLET BY MOUTH EVERY OTHER DAY 90 tablet 1   No facility-administered medications prior to visit.         Objective:   Physical Exam  Vitals:   04/13/22 0935  BP: 130/74  Pulse: 87  Temp: 97.8 F (36.6 C)  TempSrc: Oral  SpO2: 99%  Weight: 138 lb (62.6 kg)  Height: '5\' 7"'$  (1.702 m)    Gen: Pleasant, well-nourished, in no distress,  normal affect  ENT: No lesions,  mouth clear,  oropharynx clear, no postnasal drip  Neck: No JVD, no stridor  Lungs: No use of accessory muscles, no crackles or wheezing on normal respiration, no wheeze on forced expiration  Cardiovascular: RRR, heart sounds normal, no murmur or  gallops, no peripheral edem  Musculoskeletal: No deformities, no cyanosis or clubbing  Neuro: alert, awake, non focal  Skin: Warm, no lesions or rash      Assessment & Plan:  Solitary pulmonary nodule on lung CT Mixed density pulmonary nodule, new compared with 1 year prior.  High suspicion for possible adenocarcinoma.  Will plan to perform PFT, PET scan and then determine next best diagnostics.  She may be a candidate for primary resection.  Alternatively she may decide to pursue navigational bronchoscopy to have a tissue diagnosis before committing to surgery.  She is interested in participation in the [place] study for nodule risk stratification.  We will give her information about this.  We reviewed the CT scan of the chest today. We will arrange for a PET scan to be done at the end of December We will arrange for pulmonary function testing to be done next available We will review your studies and decide next steps in evaluation of your right upper lobe nodule We discussed a clinical trial to help risk stratify pulmonary nodules.  Will give you more information about this today. Follow Dr. Lamonte Sakai next available after your PET scan so we can review the results together and decide next steps.   Baltazar Apo, MD, PhD 04/13/2022, 12:58 PM [facility] [phone] or if no answer before 7:00PM call [phone] For any issues after 7:00PM please call eLink [phone]

## 2022-04-13 NOTE — Patient Instructions (Signed)
We reviewed the CT scan of the chest today. We will arrange for a PET scan to be done at the end of December We will arrange for pulmonary function testing to be done next available We will review your studies and decide next steps in evaluation of your right upper lobe nodule We discussed a clinical trial to help risk stratify pulmonary nodules.  Will give you more information about this today. Follow Dr. Lamonte Sakai next available after your PET scan so we can review the results together and decide next steps.

## 2022-04-14 NOTE — Research (Signed)
Title: [place]: CliNIcal Utility of ManaGement of Patients witH CT and LDCT Identified Pulmonary Nodules UsinG the Percepta NasAL Swab ClassifiEr -- with Familiarization   Protocol #: [id number] Sponsor: [employer]   Protocol Revision 1 dated 01Sep2022 and confirmed current on today's visit, IRB approved Revision 1 on 29Dec2022.   Objectives:  Primary: To evaluate if use of the Percepta Nasal Swab test in the diagnostic work up of newly identified pulmonary nodules reduces the number of invasive procedures in the group classified as low-risk by the test and that are benign as compared to a control group managed without a Percepta Nasal Swab test result.                   A newly identified nodule is defined as any nodule first identified on imaging                   <90 days prior to nasal sample collection that hasn't undergone a diagnostic                    procedure for the management of their index nodule prior to enrollment.                   CT imaging includes conventional CT, LDCT, HRCT                   Benign diagnosis is defined as a specific diagnosis of a benign condition,                    radiographic resolution or stability at ? 24 months, or no cytological,                    radiological, or pathological evidence of cancer.                   Procedures will be categorized as either invasive or non-invasive in the Data                    Management Plan (DMP). Secondary: To evaluate if use of the Percepta Nasal Swab test in the diagnostic work up of newly identified pulmonary nodules increases the proportion of subjects classified as high-risk by the test and have primary lung cancer that go directly to appropriate therapy as compared to a control group managed without a Percepta Nasal Swab test result.                    Proportion of subjects that go directly to appropriate therapy is defined as those                     subjects that undergo surgery, ablative  or other appropriate therapy as the next                     step after the Percepta Nasal Swab test result without intervening non-surgical                     procedures                             a. Non-surgical procedures include diagnostic PET, but not PET for                                   staging purposes.                             b. Appropriate therapies will be defined in the CRF.                    A newly identified nodule is defined as any nodule first identified on imaging                    <90 days prior to nasal sample collection.                    Lung cancer diagnosis is defined as established by cytology or pathology, or in                     circumstances where a presumptive diagnosis of cancer led to definitive                     ablative or other appropriate therapy without pathology. Key Inclusion Criteria:  Inclusion Criteria:  Able to tolerate nasal epithelial specimen collection  Signed written Informed Consent obtained  Subject clinical history available for review by sponsor and regulatory agencies  New nodule first identified on imaging < 90 days prior to nasal sample collection (index nodule)  CT report available for index nodule  1 - 40 years of age  Current or former smoker (>100 cigarettes in a lifetime)  Pulmonary nodule ?30 mm detected by CT  Key Exclusion Criteria: Exclusion Criteria  Subject has undergone a diagnostic procedure for the management of their index nodule after the index CT and prior to enrollment  Active cancer (other than non-melanoma skin cancer)  Prior primary lung cancer (prior non-lung cancer acceptable)  Prior participation in this study (i.e., subjects may not be enrolled more than once)  Current active treatment with an investigational device or drug (patients in trial follow up period are okay if intervention phase is complete)  Patient enrolled or planned to be enrolled in another clinical trial that may influence  management of the patient's nodule  Concurrent or planned use of tools or tests for assigning lung nodule risk of malignancy (e.g., genomic or proteomic blood tests) other than clinically validated risk calculators  Clinical Research Coordinator / Research RN note : This visit is for Michelle Moreno, Michelle Moreno with DOB: [birth year] on 7/Dec/2023 for the above protocol is an Enrollment Visit and is for purpose of research.    Subject expressed interest and consent in continuing as a study subject. Subject confirmed contact information (e.g. address, telephone, email). Subject thanked for participation in research and contribution to science.     During this visit on 7/Dec/2023, the subject reviewed and signed the consent form, provided demographics, and had a nasal swab collected per the above referenced protocol. Please refer to the subject's paper source binder for further details.   The PI and Sub-I met/discussed the subject prior to consenting patient.  The sub-Investigator  Dr. Baltazar Apo MD-PhD was present for the consenting process.         Signed by [place] Assistant 8/Dec/2023

## 2022-05-02 ENCOUNTER — Other Ambulatory Visit (HOSPITAL_BASED_OUTPATIENT_CLINIC_OR_DEPARTMENT_OTHER): Payer: Self-pay | Admitting: Internal Medicine

## 2022-05-03 ENCOUNTER — Other Ambulatory Visit (HOSPITAL_BASED_OUTPATIENT_CLINIC_OR_DEPARTMENT_OTHER): Payer: Self-pay | Admitting: Internal Medicine

## 2022-05-04 ENCOUNTER — Encounter (HOSPITAL_COMMUNITY)
Admission: RE | Admit: 2022-05-04 | Discharge: 2022-05-04 | Disposition: A | Discharge status: Home / Self Care | Payer: Medicare Other | Source: Ambulatory Visit | Attending: Emergency Medicine | Admitting: Emergency Medicine

## 2022-05-04 DIAGNOSIS — R911 Solitary pulmonary nodule: Secondary | ICD-10-CM | POA: Diagnosis present

## 2022-05-04 LAB — GLUCOSE, CAPILLARY: Glucose-Capillary: 125 mg/dL — ABNORMAL HIGH (ref 70–99)

## 2022-05-04 MED ORDER — FLUDEOXYGLUCOSE F - 18 (FDG) INJECTION
6.8000 | Freq: Once | INTRAVENOUS | Status: AC
  Administered 2022-05-04: 6.8 via INTRAVENOUS

## 2022-05-10 ENCOUNTER — Ambulatory Visit: Payer: Medicare Other | Admitting: Nurse Practitioner

## 2022-05-11 ENCOUNTER — Encounter: Payer: Self-pay | Admitting: Emergency Medicine

## 2022-05-11 NOTE — Telephone Encounter (Signed)
Pt's appts have been cancelled. Nothing further needed.

## 2022-05-11 NOTE — Telephone Encounter (Signed)
I reviewed the PET scan with the patient.  Her pulmonary nodule in question is disappeared.  She has serial follow-up CTs arranged to evaluate her aortic aneurysm so she will have another 1 in a year for that reason.  She does not need any serial imaging with regard to the pulmonary nodule.  Reassured her about this.  Please cancel her scheduled PFT and her scheduled OV with RB.  Thank you

## 2022-05-11 NOTE — Telephone Encounter (Signed)
Mychart message sent by pt: Michelle Moreno [facility] (supporting Collene Gobble, MD)1 minute ago (10:47 AM)    Would like to hear Dr. Sudie Bailey review of pet-scan I had on Dec. 28.    Dr. Lamonte Sakai, please advise.

## 2022-06-01 ENCOUNTER — Ambulatory Visit: Payer: Medicare Other | Admitting: Emergency Medicine

## 2022-08-04 ENCOUNTER — Telehealth: Payer: Self-pay | Admitting: Emergency Medicine

## 2022-08-04 NOTE — Telephone Encounter (Signed)
I was calling to resched PT for PFT and FU with Dr. Lamonte Sakai. PT said they had thought she had Cancer and it was just scar tissue so she wonders if she needs to resched w/PFT and/or Appt to see Dr. Lamonte Sakai.   Pls advise front desk and we will call to sched both or just FU.

## 2022-08-04 NOTE — Telephone Encounter (Signed)
Rip Harbour so per Dr Everlene Other message to patient she is not needing any further PFTs or office visit follow ups with [place].   Thank you

## 2022-12-26 ENCOUNTER — Telehealth (HOSPITAL_BASED_OUTPATIENT_CLINIC_OR_DEPARTMENT_OTHER): Payer: Self-pay | Admitting: Internal Medicine

## 2022-12-26 DIAGNOSIS — I7121 Aneurysm of the ascending aorta, without rupture: Secondary | ICD-10-CM

## 2022-12-26 DIAGNOSIS — E785 Hyperlipidemia, unspecified: Secondary | ICD-10-CM

## 2022-12-26 NOTE — Telephone Encounter (Signed)
Patient would like to know if there is any opening in November.  She also ask for Labs and if CT needs to be done prior to appt.  Please advise on all 3 thank you

## 2022-12-26 NOTE — Telephone Encounter (Signed)
Received a patient message via patient schedule stating: "I called early in August, was told Dr. Rennis Golden has no available appointments thru the end of the year. I usually see him every year in November, after I've had my blood work and ct scan. I need to know does this mean Dr. Rennis Golden wa ts me to skip having my blood work and ct scan this year. I can go to both of his locations if nesscessary. I would appreciate someone please let me know."   Patient is on the wait list for Dr. Rennis Golden, but there is no active orders for a CT.   Please advise.

## 2022-12-27 NOTE — Telephone Encounter (Signed)
CT angiogram chest/aorta, lipid panel, CMET ordered -- per previous ordered.   Message sent to scheduling team to contact patient for CT test.   Message sent to scheduling team to add on patient for 12/5 with Hilty MD at [facility] -- if this works for her

## 2023-01-02 NOTE — Telephone Encounter (Signed)
CT 12/3 OV 12/5

## 2023-01-28 ENCOUNTER — Other Ambulatory Visit (HOSPITAL_BASED_OUTPATIENT_CLINIC_OR_DEPARTMENT_OTHER): Payer: Self-pay | Admitting: Internal Medicine

## 2023-03-03 ENCOUNTER — Other Ambulatory Visit (HOSPITAL_BASED_OUTPATIENT_CLINIC_OR_DEPARTMENT_OTHER): Payer: Self-pay | Admitting: Internal Medicine

## 2023-03-13 ENCOUNTER — Telehealth: Payer: Self-pay | Admitting: Internal Medicine

## 2023-03-13 DIAGNOSIS — I7121 Aneurysm of the ascending aorta, without rupture: Secondary | ICD-10-CM

## 2023-03-13 NOTE — Telephone Encounter (Signed)
Patient canceled CT ANGIO CHEST AORTA W/CM &/OR stated she did not want to do this test as she wants to skip testing this year.  Patient stated she wants to have Virtual Visit on 12/5 instead and results from her lab work can be posted to her MyChart prior to that visit.

## 2023-03-15 NOTE — Telephone Encounter (Signed)
Appointment type changed CT test cancelled

## 2023-04-03 ENCOUNTER — Other Ambulatory Visit (HOSPITAL_BASED_OUTPATIENT_CLINIC_OR_DEPARTMENT_OTHER): Payer: Self-pay | Admitting: Internal Medicine

## 2023-04-04 LAB — COMPREHENSIVE METABOLIC PANEL
ALT: 12 [IU]/L (ref 0–32)
AST: 15 [IU]/L (ref 0–40)
Albumin: 4.3 g/dL (ref 3.8–4.8)
Alkaline Phosphatase: 44 [IU]/L (ref 44–121)
BUN/Creatinine Ratio: 19 (ref 12–28)
BUN: 15 mg/dL (ref 8–27)
Bilirubin Total: 0.4 mg/dL (ref 0.0–1.2)
CO2: 22 mmol/L (ref 20–29)
Calcium: 9.3 mg/dL (ref 8.7–10.3)
Chloride: 102 mmol/L (ref 96–106)
Creatinine, Ser: 0.79 mg/dL (ref 0.57–1.00)
Globulin, Total: 2.2 g/dL (ref 1.5–4.5)
Glucose: 97 mg/dL (ref 70–99)
Potassium: 3.9 mmol/L (ref 3.5–5.2)
Sodium: 140 mmol/L (ref 134–144)
Total Protein: 6.5 g/dL (ref 6.0–8.5)
eGFR: 79 mL/min/{1.73_m2} (ref >59)

## 2023-04-04 LAB — LIPID PANEL
Chol/HDL Ratio: 2.3 {ratio} (ref 0.0–4.4)
Cholesterol, Total: 209 mg/dL — ABNORMAL HIGH (ref 100–199)
HDL: 89 mg/dL (ref >39)
LDL Chol Calc (NIH): 107 mg/dL — ABNORMAL HIGH (ref 0–99)
Triglycerides: 75 mg/dL (ref 0–149)
VLDL Cholesterol Cal: 13 mg/dL (ref 5–40)

## 2023-04-10 ENCOUNTER — Other Ambulatory Visit (HOSPITAL_BASED_OUTPATIENT_CLINIC_OR_DEPARTMENT_OTHER): Payer: Medicare Other

## 2023-04-12 ENCOUNTER — Ambulatory Visit: Payer: Medicare Other | Attending: Internal Medicine | Admitting: Internal Medicine

## 2023-04-12 ENCOUNTER — Encounter: Payer: Self-pay | Admitting: Internal Medicine

## 2023-04-12 VITALS — BP 105/75 | HR 78 | Ht 67.0 in | Wt 136.0 lb

## 2023-04-12 DIAGNOSIS — I7121 Aneurysm of the ascending aorta, without rupture: Secondary | ICD-10-CM | POA: Insufficient documentation

## 2023-04-12 DIAGNOSIS — I10 Essential (primary) hypertension: Secondary | ICD-10-CM | POA: Insufficient documentation

## 2023-04-12 DIAGNOSIS — E785 Hyperlipidemia, unspecified: Secondary | ICD-10-CM | POA: Insufficient documentation

## 2023-04-12 NOTE — Patient Instructions (Addendum)
Medication Instructions:  NO CHANGES  *If you need a refill on your cardiac medications before your next appointment, please call your pharmacy*   Lab Work: FASTING lipid panel in 6 months -- June 2025  If you have labs (blood work) drawn today and your tests are completely normal, you will receive your results only by: MyChart Message (if you have MyChart) OR A paper copy in the mail If you have any lab test that is abnormal or we need to change your treatment, we will call you to review the results.   Testing/Procedures: CT angiogram chest/aorta in 10 months October 2025  Follow-Up: At [facility], you and your health needs are our priority.  As part of our continuing mission to provide you with exceptional heart care, we have created designated Provider Care Teams.  These Care Teams include your primary Cardiologist (physician) and Advanced Practice Providers (APPs -  Physician Assistants and Nurse Practitioners) who all work together to provide you with the care you need, when you need it.  We recommend signing up for the patient portal called "MyChart".  Sign up information is provided on this After Visit Summary.  MyChart is used to connect with patients for Virtual Visits (Telemedicine).  Patients are able to view lab/test results, encounter notes, upcoming appointments, etc.  Non-urgent messages can be sent to your provider as well.   To learn more about what you can do with MyChart, go to [email or web address].    Your next appointment:   10 months with Dr. Rennis Golden - October 2025 **CALL in May/June for an appointment**

## 2023-04-12 NOTE — Progress Notes (Signed)
Virtual Visit via Video Note   Because of Michelle Moreno's co-morbid illnesses, she is at least at moderate risk for complications without adequate follow up.  This format is felt to be most appropriate for this patient at this time.  All issues noted in this document were discussed and addressed.  A limited physical exam was performed with this format.  Please refer to the patient's chart for her consent to telehealth for [facility].      Date:  04/12/2023   ID:  Michelle Moreno, DOB 1951/04/06, MRN [id number] The patient was identified using 2 identifiers.  Evaluation Performed:  Follow-Up Visit  Patient Location:  [address] [place] [address]  Provider location:   [address] [place], [place] [address]  PCP:  Enzo Montgomery Peter Garter, MD  Cardiologist:  Chrystie Nose, MD Electrophysiologist:  None   Chief Complaint:  Follow-up  History of Present Illness:    Michelle Moreno is a 72 y.o. female who presents via audio/video conferencing for a telehealth visit today.  Her family history is very significant for heart disease. She tells me that her mother had a abdominal aortic aneurysm and is deceased. Her father heart disease. She also has a brother has heart disease at a young age and a sister who had a cerebral aneurysm and died. Personally she has no known coronary history. She does have a history of smoking in the past and risk factors including hypertension, age, dyslipidemia and is had PVCs. She denies any chest pain or worsening shortness of breath with exertion however is not particularly active. She is going to start doing yoga soon and wants to know if it's okay for her to start exercise. She also has concerns about her mother who had abdominal aortic aneurysm. Given her history of smoking and hypertension as well as family history she may be at risk for this. In addition, she is wondering whether she should have screening for cerebral  aneurysms. Current guidelines recommend screening for cerebral aneurysms in patients who have 2 first-degree relatives with cerebral aneurysms. The incidence of cerebral aneurysm in patients with one first-degree relative is very low around 1%, therefore screening is not likely to be beneficial.   I saw Michelle Moreno back in the office today. 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 This was also noted on the radiology over read including a number of pulmonary nodules and hypodensities in the liver consistent with probably cysts. She also underwent abdominal ultrasound which was negative for any abdominal aortic aneurysm. While there was no coronary calcium there is atherosclerosis of the abdominal aorta that was noted. Laboratory work was reviewed from her primary care provider including a total cholesterol 240 with LDL greater than 120.    Michelle Moreno returns today for follow-up. She's had some occasional episodes of discomfort in her chest at night particularly laying down. I did not suspect this is cardiac given her recent 0 coronary calcium score. She does have an ascending aortic aneurysm measuring 4.3 cm and is ordered for repeat CT of the aorta next February. Blood pressure today was very elevated in the office at 158/102. She reports significant whitecoat hypertension. She did take her valsartan and today. I rechecked her blood pressure at the end of the visit which should come down to 145/98. Still elevated.  02/23/2016   Michelle Moreno returns today for follow-up of her ascending aortic aneurysm. She underwent another CT scan of her aorta in January of this year. This demonstrated an ectatic/bordering on aneurysmal ascending thoracic aorta with a maximum diameter 3.9 cm. The aortic root was measured at 3.4 cm without effacement at the sinotubular junction. This  contrasts an earlier CT finding of a dilated root to 4.3 cm. This is cause Michelle Moreno significant anxiety. I tried to explain the results to her today and the fact that there is some discrepancy on the CT findings may be related to operator air in measurement. The good news is that the descending aorta is now measuring smaller, but not normal. We will need additional data points to understand whether there has been any significant changes in the size of the vessel.   02/12/2017   Michelle Moreno was seen today in follow-up. In February this year she had a repeat CT scan of her aorta which showed a stable ectatic aortic root measuring 3.9 cm. She had a number of small subcentimeter nodules which were stable compared to the CT scan finding in 2007 and are considered benign by the radiologist. He denies any worsening shortness of breath. She gets some occasional chest discomfort in the left axillary area. This is not worse with exertion or relieved by rest and is more notable oftentimes at night while laying in bed. She is noted that she gets discomfort in her legs. Sometimes with walking or laying in bed at night. She gets achiness and restlessness. She also has some prominent varicose veins of the left anterior thigh and sounds like she may have had some phlebitis associated with this with burning and tingling as well as swelling. She saw vein specialist in [place] who felt that she could have a treatment for this but wanted her to discuss with me about that cardiac implications of ablating veins in her legs. We discussed this today and in fact I informed her that if she has varicose veins, they would not be considered to use as conduits for example for coronary bypass at any point in the future. She has tried stocking use but has been intolerant of them.   02/12/2018   Michelle Moreno is seen today in follow-up.  She has been working with vein and vascular surgery including Dr. early for saphenous vein  ablation.  She has had significant relief from those procedures.  She denies any chest pain or worsening shortness of breath.  Her last CT scan in 2018 showed an ectatic aorta measuring 3.9 cm.  It was not necessarily recommended to follow this annually however will probably get another CT scan next year.  Other complaints include some soreness in her hands and stiffness.  Blood pressure is better controlled now after recently changing to losartan, however she has concerns about the recall.   02/17/2019   Michelle Moreno returns today for follow-up.  She denies any chest pain or worsening shortness of breath.  Her recent lipid profile showed total cholesterol 158, triglycerides 76, HDL 85 and LDL 59.  She denies any symptomatic PVCs however they are noted on her EKG today.  She has had variable measurements of her CT angiogram regarding an ectatic aorta.  It now measures 4.1 cm as of February 2020 however had measured 3.9 cm and prior to that was 4.3 cm.  She will have repeat testing in 1 year.   03/17/2020   Michelle Moreno seen today in follow-up.  Overall she is doing well.  In February she had a CT angiogram which showed stable aortic aneurysm size of 4.1 cm.  She is currently living in [address] full-time and comes back occasionally for doctor visits.  Blood pressure is good today 128/94.  EKG shows sinus rhythm with PVCs.  She is unaware of the PVCs and is noted to have these in the past.  She has not had a recent lipid profile but her last study was in October 2020 showed an LDL of 59.   04/07/2019  Michelle Moreno returns today for follow-up.  Overall she is doing well.  Her blood pressure again was a little elevated, particularly the diastolic blood pressure was 98.  Looking back her diastolic blood pressure has run in the 90s for a while although systolic pressures seem well controlled.  She denies any chest pain or shortness of breath.  She is on low-dose rosuvastatin and total cholesterol is 176,  HDL 85, LDL now 77, down from 132 and triglycerides 77.  Just had a repeat CT angiogram of her aorta which showed a stable dilated aorta at 4.0 cm.   04/06/2022   Michelle Moreno is seen today for routine follow-up.  She had a recent repeat CT scan to assess the size of her aorta.  The CT scan showed stable thoracic aortic aneurysm at 4.0 cm.  There was a new finding of a 2.2 x 1.5 cm more solid appearing/groundglass consolidative area in the right upper lobe.  Findings concerning for neoplasm, with infection being less likely.  Today she reports feeling pretty well.  She has not had any recent unexpected weight loss.  She has not had any upper respiratory symptoms, fever, cough or other associated findings.  She does have a smoking history of about 15 pack years and quit about 20 years ago.  04/12/2023  Michelle Moreno is seen today via virtual visit.  She had further workup by Dr. Delton Coombes in pulmonary for lung nodule.  Fortunately, recent PET scanning shows that that resolved.  He recommended follow-ups as per CT follow-up of her aorta.  She was supposed to have that imaging done now however had declined it because of concern about multiple imaging modalities and radiation exposure.  She wants to wait until next year.  Blood pressure appears to be well-controlled.  She did have recent repeat lipids.  Her cholesterol is trending up.  Total 209, HDL 89, triglycerides 75 and LDL 107.  She is on low-dose rosuvastatin but reports her diet has been less ideal and she has not been as active as she usually is.  Prior CV studies:   The following studies were reviewed today:  Labwork  PMHx:  Past Medical History:  Diagnosis Date   Asthma    HTN (hypertension)    Hyperlipidemia    Lung nodule    PVC's (premature ventricular contractions)     Past Surgical History:  Procedure Laterality Date   HERNIA REPAIR  1998   abdominal   TOTAL VAGINAL HYSTERECTOMY  2001    FAMHx:  Family History  Problem  Relation Age of Onset   Coronary artery disease Brother    Heart disease Brother    Cerebral aneurysm Sister    Alzheimer's disease Unknown    Hypertension Unknown    AAA (abdominal aortic aneurysm) Mother    Heart disease Father    Colon cancer Neg Hx    Stomach cancer Neg Hx     SOCHx:  reports that she quit smoking about 21 years ago. Her smoking use included cigarettes. She has never used smokeless tobacco. She reports current alcohol use of about 7.0 standard drinks of alcohol per week. She reports that she does not use drugs.  ALLERGIES:  No Known Allergies  MEDS:  Current Meds  Medication Sig   cetirizine (ZYRTEC) 10 MG tablet Take 10 mg by mouth as needed for allergies (allergies).   estradiol (CLIMARA - DOSED IN MG/24 HR) 0.1 mg/24hr Place 1 patch onto the skin once a week.    Fluticasone-Salmeterol (ADVAIR) 250-50 MCG/DOSE AEPB Inhale 1 puff into the lungs daily.   losartan (COZAAR) 100 MG tablet TAKE 1 TABLET BY MOUTH EVERY DAY   PROAIR HFA 108 (90 Base) MCG/ACT inhaler Inhale two (2) puffs into the lungs every four (4) to six (6) hours as needed for shortness of breath or wheezing.   rosuvastatin (CRESTOR) 5 MG tablet TAKE 1 TABLET BY MOUTH EVERY OTHER DAY     ROS: Pertinent items noted in HPI and remainder of comprehensive ROS otherwise negative.  Labs/Other Tests and Data Reviewed:    Recent Labs: 04/03/2023: ALT 12; BUN 15; Creatinine, Ser 0.79; Potassium 3.9; Sodium 140   Recent Lipid Panel Lab Results  Component Value Date/Time   CHOL 209 (H) 04/03/2023 08:45 AM   TRIG 75 04/03/2023 08:45 AM   HDL 89 04/03/2023 08:45 AM   CHOLHDL 2.3 04/03/2023 08:45 AM   CHOLHDL 1.5 10/29/2014 08:58 AM   LDLCALC 107 (H) 04/03/2023 08:45 AM    Wt Readings from Last 3 Encounters:  04/12/23 136 lb (61.7 kg)  04/13/22 138 lb (62.6 kg)  04/06/22 141 lb (64 kg)     Exam:    Vital Signs:  BP 105/75 (BP Location: Right Arm, Patient Position: Sitting, Cuff Size:  Normal)   Pulse 78   Ht 5\' 7"  (1.702 m)   Wt 136 lb (61.7 kg)   BMI 21.30 kg/m    General appearance: alert and no distress Lungs: No visual respiratory difficulty Abdomen: Normal weight Extremities: extremities normal, atraumatic, no cyanosis or edema Neurologic: Grossly normal  ASSESSMENT & PLAN:    Abnormal lung parenchymal findings on CT with a 2.2 x 1.5 cm area of solid and groundglass consolidation in the right upper lobe (03/2022) Ectatic or borderline aneurysmal ascending aorta between 3.9 and 4.3 cm -measured 4.1 cm by CTA (06/2019) Abnormal EKG -asymptomatic PVCs History of tobacco abuse Hypertension Family history of abdominal aortic aneurysm and cerebral aneurysm Dyslipidemia, goal LDL less than 100 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  Overall she says she is feeling well.  Denies chest pain or shortness of breath.  She is due by guideline recommendations for repeat CT scan of her aorta however because of recent imaging including a PET scan and other studies she does not want to have a CT this year.  Will plan to repeat that next year.  Her lipids are trending upwards.  She is compliant with her statin however she notes her diet has been less than ideal and she is less active.  I have encouraged her to continue to work on that.  Will plan repeat lipid testing in about 6 months.  Her target LDL is less than 100.   Patient Risk:   After full review of this patients clinical status, I feel that they are at least moderate  risk at this time.  Time:   Today, I have spent 25 minutes with the patient with telehealth technology discussing dyslipidemia, hypertension, aortic aneurysm.     Medication Adjustments/Labs and Tests Ordered: Current medicines are reviewed at length with the patient today.  Concerns regarding medicines are outlined above.   Tests Ordered: Orders  Placed This Encounter  Procedures   Lipid panel    Medication Changes: No orders of the defined types were placed in this encounter.   Disposition:  in 10 month(s)  Chrystie Nose, MD, [facility], FACP  [place]  [facility]  Medical Director of the Advanced Lipid Disorders &  Cardiovascular Risk Reduction Clinic Diplomate of the American Board of Clinical Lipidology Attending Cardiologist  Direct Dial: [phone]  Fax: [phone]  Website:  [email or web address]  Chrystie Nose, MD  04/12/2023 9:55 AM

## 2023-08-31 ENCOUNTER — Other Ambulatory Visit (HOSPITAL_BASED_OUTPATIENT_CLINIC_OR_DEPARTMENT_OTHER): Payer: Self-pay | Admitting: Internal Medicine

## 2024-01-18 ENCOUNTER — Other Ambulatory Visit (HOSPITAL_BASED_OUTPATIENT_CLINIC_OR_DEPARTMENT_OTHER): Payer: Self-pay | Admitting: Internal Medicine

## 2024-03-20 ENCOUNTER — Ambulatory Visit: Admitting: Internal Medicine

## 2024-04-10 ENCOUNTER — Other Ambulatory Visit (HOSPITAL_BASED_OUTPATIENT_CLINIC_OR_DEPARTMENT_OTHER): Payer: Self-pay | Admitting: Internal Medicine

## 2024-04-15 MED ORDER — LOSARTAN POTASSIUM 100 MG PO TABS: 100.0000 mg | ORAL_TABLET | Freq: Every day | ORAL | 0 refills | Status: AC

## 2024-04-15 NOTE — Addendum Note (Signed)
 Addended by: BLUFORD, Eniola Cerullo L on: 04/15/2024 03:50 PM   Modules accepted: Orders
